# Patient Record
Sex: Female | Born: 1990
Health system: Southern US, Community
[De-identification: ages and names within clinical notes are randomized; demographics above are authoritative.]

## PROBLEM LIST (undated history)

## (undated) DIAGNOSIS — R209 Unspecified disturbances of skin sensation: Secondary | ICD-10-CM

## (undated) DIAGNOSIS — G219 Secondary parkinsonism, unspecified: Secondary | ICD-10-CM

## (undated) DIAGNOSIS — G43909 Migraine, unspecified, not intractable, without status migrainosus: Secondary | ICD-10-CM

## (undated) DIAGNOSIS — R251 Tremor, unspecified: Secondary | ICD-10-CM

## (undated) DIAGNOSIS — F29 Unspecified psychosis not due to a substance or known physiological condition: Secondary | ICD-10-CM

## (undated) DIAGNOSIS — R413 Other amnesia: Secondary | ICD-10-CM

## (undated) DIAGNOSIS — G43019 Migraine without aura, intractable, without status migrainosus: Principal | ICD-10-CM

## (undated) DIAGNOSIS — Z9109 Other allergy status, other than to drugs and biological substances: Secondary | ICD-10-CM

## (undated) DIAGNOSIS — M797 Fibromyalgia: Secondary | ICD-10-CM

## (undated) DIAGNOSIS — F209 Schizophrenia, unspecified: Secondary | ICD-10-CM

## (undated) HISTORY — DX: Migraine without aura, intractable, without status migrainosus: G43.019

## (undated) HISTORY — DX: Other allergy status, other than to drugs and biological substances: Z91.09

## (undated) HISTORY — DX: Fibromyalgia: M79.7

## (undated) HISTORY — DX: Unspecified disturbances of skin sensation: R20.9

## (undated) HISTORY — DX: Migraine, unspecified, not intractable, without status migrainosus: G43.909

## (undated) HISTORY — DX: Secondary parkinsonism, unspecified: G21.9

## (undated) HISTORY — DX: Other amnesia: R41.3

## (undated) HISTORY — DX: Tremor, unspecified: R25.1

## (undated) HISTORY — DX: Schizophrenia, unspecified: F20.9

---
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
## 2012-01-29 ENCOUNTER — Other Ambulatory Visit (HOSPITAL_COMMUNITY): Payer: Self-pay | Admitting: Psychiatry

## 2012-01-29 LAB — CBC
MCH: 28.9 pg (ref 26.0–34.0)
MCHC: 33.6 g/dL (ref 30.0–36.0)
MCV: 86.2 fL (ref 78.0–100.0)
Platelets: 293 10*3/uL (ref 150–400)
RBC: 4.7 MIL/uL (ref 3.87–5.11)

## 2012-08-21 ENCOUNTER — Encounter: Payer: Self-pay | Admitting: Certified Nurse Midwife

## 2012-08-28 ENCOUNTER — Encounter: Payer: Self-pay | Admitting: Certified Nurse Midwife

## 2012-08-28 ENCOUNTER — Ambulatory Visit (INDEPENDENT_AMBULATORY_CARE_PROVIDER_SITE_OTHER): Payer: BC Managed Care – PPO | Admitting: Certified Nurse Midwife

## 2012-08-28 VITALS — BP 102/60 | HR 60 | Resp 16 | Ht 64.75 in | Wt 169.0 lb

## 2012-08-28 DIAGNOSIS — Z01419 Encounter for gynecological examination (general) (routine) without abnormal findings: Secondary | ICD-10-CM

## 2012-08-28 DIAGNOSIS — N943 Premenstrual tension syndrome: Secondary | ICD-10-CM

## 2012-08-28 MED ORDER — DESOGEST-ETH ESTRAD TRIPHASIC 0.1/0.125/0.15 -0.025 MG PO TABS: 1.0000 | ORAL_TABLET | Freq: Every day | ORAL | Status: DC

## 2012-08-28 NOTE — Progress Notes (Signed)
22 y.o. G0P0000 Single Caucasian Fe here for annual exam.  Periods normal.  Much lighter with OCP use for PMS.  Reports PMS much better, no more hugh mood swings.  Now on Lyrica for Fibromyalgia, working well Not sexually active ever. Sees PCP for labs and medication management.  No health issues today.  No LMP recorded.   08-07-12       Sexually active: no  The current method of family planning is abstinence.    Exercising: yes  physical therapy & horseback riding Smoker:  no  Health Maintenance: Pap: none MMG:  none Colonoscopy:  none BMD:   none TDaP:  2008 Labs: none Self breast exam: occ   reports that she has never smoked. She does not have any smokeless tobacco history on file. She reports that she does not drink alcohol or use illicit drugs.  Past Medical History  Diagnosis Date  . Asthma   . Migraines     no aura  . Asthma     seasonal  . Occasional tremors     No past surgical history on file.  Current Outpatient Prescriptions  Medication Sig Dispense Refill  . ARIPiprazole (ABILIFY) 10 MG tablet Take 10 mg by mouth daily.      . Cetirizine HCl 10 MG CAPS Take by mouth daily.      Marland Kitchen desogestrel-ethinyl estradiol (CYCLESSA) 0.1/0.125/0.15 -0.025 MG tablet Take 1 tablet by mouth daily.      Marland Kitchen FLUoxetine (PROZAC) 40 MG capsule Take 40 mg by mouth daily.      . Multiple Vitamin (MULITIVITAMIN WITH MINERALS) TABS Take 1 tablet by mouth daily.      . norgestimate-ethinyl estradiol (ORTHO-CYCLEN,SPRINTEC,PREVIFEM) 0.25-35 MG-MCG tablet Take 1 tablet by mouth daily.      . propranolol (INDERAL) 40 MG tablet Take 20 mg by mouth daily.       No current facility-administered medications for this visit.    Family History  Problem Relation Age of Onset  . Breast cancer Mother   . Diabetes Father   . Hypertension Father   . Thyroid disease Maternal Aunt   . Thyroid disease Maternal Grandmother     ROS:  Pertinent items are noted in HPI.  Otherwise, a comprehensive ROS  was negative.  Exam:   There were no vitals taken for this visit.    Ht Readings from Last 3 Encounters:  05/22/11 5' 5.5" (1.664 m) (69%*, Z = 0.50)   * Growth percentiles are based on CDC 2-20 Years data.    General appearance: alert, cooperative and appears stated age Head: Normocephalic, without obvious abnormality, atraumatic Neck: no adenopathy, supple, symmetrical, trachea midline and thyroid normal to inspection and palpation Lungs: clear to auscultation bilaterally Breasts: normal appearance, no masses or tenderness, No nipple retraction or dimpling, No nipple discharge or bleeding, No axillary or supraclavicular adenopathy, Taught monthly breast self examination Heart: regular rate and rhythm Abdomen: soft, non-tender; no masses,  no organomegaly Extremities: extremities normal, atraumatic, no cyanosis or edema, occasional tremor noted  Skin: Skin color, texture, turgor normal. No rashes or lesions Lymph nodes: Cervical, supraclavicular, and axillary nodes normal. No abnormal inguinal nodes palpated Neurologic: Grossly normal   Pelvic: External genitalia:  no lesions              Urethra:  normal appearing urethra with no masses, tenderness or lesions              Bartholin's and Skene's: normal  Vagina: normal appearing vagina with normal color and discharge, no lesions              Cervix: normal non tender              Pap taken: no Bimanual Exam:  Uterus:  normal size, contour, position, consistency, mobility, non-tender and anteflexed              Adnexa: normal adnexa and no mass, fullness, tenderness               Rectovaginal: Confirms               Anus:  normal sphincter tone, no lesions  A:  Well Woman with normal exam  History of PMS, OCP working well for management Not sexually active ever  History of essential tremors since adolescent, no change  Family history of Breast cancer, mother age 20, am age 30? Both had BRAC negative   P:   Reviewed health and wellness pertinent to exam  Rx Cyclessa see order  Continue follow up with PCP as indicated  Stressed SBE, come in if any change, needs mammogram at age 15 unless otherwise indicated   Pap smear as per guidelines   pap smear not taken today  counseled on breast self exam, STD prevention, feminine hygiene, use and side effects of OCP's, adequate intake of calcium and vitamin D, diet and exercise  return annually or prn  An After Visit Summary was printed and given to the patient.  Reviewed, TL

## 2012-08-28 NOTE — Patient Instructions (Addendum)
General topics  Next pap or exam is  due in 1 year Take a Women's multivitamin Take 1200 mg. of calcium daily - prefer dietary If any concerns in interim to call back  Breast Self-Awareness Practicing breast self-awareness may pick up problems early, prevent significant medical complications, and possibly save your life. By practicing breast self-awareness, you can become familiar with how your breasts look and feel and if your breasts are changing. This allows you to notice changes early. It can also offer you some reassurance that your breast health is good. One way to learn what is normal for your breasts and whether your breasts are changing is to do a breast self-exam. If you find a lump or something that was not present in the past, it is best to contact your caregiver right away. Other findings that should be evaluated by your caregiver include nipple discharge, especially if it is bloody; skin changes or reddening; areas where the skin seems to be pulled in (retracted); or new lumps and bumps. Breast pain is seldom associated with cancer (malignancy), but should also be evaluated by a caregiver. BREAST SELF-EXAM The best time to examine your breasts is 5 7 days after your menstrual period is over.  ExitCare Patient Information 2013 [employer].   Exercise to Stay Healthy Exercise helps you become and stay healthy. EXERCISE IDEAS AND TIPS Choose exercises that:  You enjoy.  Fit into your day. You do not need to exercise really hard to be healthy. You can do exercises at a slow or medium level and stay healthy. You can:  Stretch before and after working out.  Try yoga, Pilates, or tai chi.  Lift weights.  Walk fast, swim, jog, run, climb stairs, bicycle, dance, or rollerskate.  Take aerobic classes. Exercises that burn about 150 calories:  Running 1  miles in 15 minutes.  Playing volleyball for 45 to 60 minutes.  Washing and waxing a car for 45 to 60  minutes.  Playing touch football for 45 minutes.  Walking 1  miles in 35 minutes.  Pushing a stroller 1  miles in 30 minutes.  Playing basketball for 30 minutes.  Raking leaves for 30 minutes.  Bicycling 5 miles in 30 minutes.  Walking 2 miles in 30 minutes.  Dancing for 30 minutes.  Shoveling snow for 15 minutes.  Swimming laps for 20 minutes.  Walking up stairs for 15 minutes.  Bicycling 4 miles in 15 minutes.  Gardening for 30 to 45 minutes.  Jumping rope for 15 minutes.  Washing windows or floors for 45 to 60 minutes. Document Released: 04/03/2010 Document Revised: 05/24/2011 Document Reviewed: 04/03/2010 [employer] Patient Information 2013 [employer].   Other topics ( that may be useful information):    Sexually Transmitted Disease Sexually transmitted disease (STD) refers to any infection that is passed from person to person during sexual activity. This may happen by way of saliva, semen, blood, vaginal mucus, or urine. Common STDs include:  Gonorrhea.  Chlamydia.  Syphilis.  HIV/AIDS.  Genital herpes.  Hepatitis B and C.  Trichomonas.  Human papillomavirus (HPV).  Pubic lice. CAUSES  An STD may be spread by bacteria, virus, or parasite. A person can get an STD by:  Sexual intercourse with an infected person.  Sharing sex toys with an infected person.  Sharing needles with an infected person.  Having intimate contact with the genitals, mouth, or rectal areas of an infected person. SYMPTOMS  Some people may not have any symptoms, but   they can still pass the infection to others. Different STDs have different symptoms. Symptoms include:  Painful or bloody urination.  Pain in the pelvis, abdomen, vagina, anus, throat, or eyes.  Skin rash, itching, irritation, growths, or sores (lesions). These usually occur in the genital or anal area.  Abnormal vaginal discharge.  Penile discharge in men.  Soft, flesh-colored skin growths in the  genital or anal area.  Fever.  Pain or bleeding during sexual intercourse.  Swollen glands in the groin area.  Yellow skin and eyes (jaundice). This is seen with hepatitis. DIAGNOSIS  To make a diagnosis, your caregiver may:  Take a medical history.  Perform a physical exam.  Take a specimen (culture) to be examined.  Examine a sample of discharge under a microscope.  Perform blood test TREATMENT   Chlamydia, gonorrhea, trichomonas, and syphilis can be cured with antibiotic medicine.  Genital herpes, hepatitis, and HIV can be treated, but not cured, with prescribed medicines. The medicines will lessen the symptoms.  Genital warts from HPV can be treated with medicine or by freezing, burning (electrocautery), or surgery. Warts may come back.  HPV is a virus and cannot be cured with medicine or surgery.However, abnormal areas may be followed very closely by your caregiver and may be removed from the cervix, vagina, or vulva through office procedures or surgery. If your diagnosis is confirmed, your recent sexual partners need treatment. This is true even if they are symptom-free or have a negative culture or evaluation. They should not have sex until their caregiver says it is okay. HOME CARE INSTRUCTIONS  All sexual partners should be informed, tested, and treated for all STDs.  Take your antibiotics as directed. Finish them even if you start to feel better.  Only take over-the-counter or prescription medicines for pain, discomfort, or fever as directed by your caregiver.  Rest.  Eat a balanced diet and drink enough fluids to keep your urine clear or pale yellow.  Do not have sex until treatment is completed and you have followed up with your caregiver. STDs should be checked after treatment.  Keep all follow-up appointments, Pap tests, and blood tests as directed by your caregiver.  Only use latex condoms and water-soluble lubricants during sexual activity. Do not use  petroleum jelly or oils.  Avoid alcohol and illegal drugs.  Get vaccinated for HPV and hepatitis. If you have not received these vaccines in the past, talk to your caregiver about whether one or both might be right for you.  Avoid risky sex practices that can break the skin. The only way to avoid getting an STD is to avoid all sexual activity.Latex condoms and dental dams (for oral sex) will help lessen the risk of getting an STD, but will not completely eliminate the risk. SEEK MEDICAL CARE IF:   You have a fever.  You have any new or worsening symptoms. Document Released: 05/22/2002 Document Revised: 05/24/2011 Document Reviewed: 05/29/2010 Exit[employer] Patient Information 2013 [employer].    Domestic Abuse You are being battered or abused if someone close to you hits, pushes, or physically hurts you in any way. You also are being abused if you are forced into activities. You are being sexually abused if you are forced to have sexual contact of any kind. You are being emotionally abused if you are made to feel worthless or if you are constantly threatened. It is important to remember that help is available. No one has the right to abuse you. PREVENTION OF FURTHER   ABUSE  Learn the warning signs of danger. This varies with situations but may include: the use of alcohol, threats, isolation from friends and family, or forced sexual contact. Leave if you feel that violence is going to occur.  If you are attacked or beaten, report it to the police so the abuse is documented. You do not have to press charges. The police can protect you while you or the attackers are leaving. Get the officer's name and badge number and a copy of the report.  Find someone you can trust and tell them what is happening to you: your caregiver, a nurse, clergy member, close friend or family member. Feeling ashamed is natural, but remember that you have done nothing wrong. No one deserves abuse. Document Released:  02/27/2000 Document Revised: 05/24/2011 Document Reviewed: 05/07/2010 Exit[employer] Patient Information 2013 [employer].    How Much is Too Much Alcohol? Drinking too much alcohol can cause injury, accidents, and health problems. These types of problems can include:   Car crashes.  Falls.  Family fighting (domestic violence).  Drowning.  Fights.  Injuries.  Burns.  Damage to certain organs.  Having a baby with birth defects. ONE DRINK CAN BE TOO MUCH WHEN YOU ARE:  Working.  Pregnant or breastfeeding.  Taking medicines. Ask your doctor.  Driving or planning to drive. If you or someone you know has a drinking problem, get help from a doctor.  Document Released: 12/26/2008 Document Revised: 05/24/2011 Document Reviewed: 12/26/2008 Exit[employer] Patient Information 2013 [employer].   Smoking Hazards Smoking cigarettes is extremely bad for your health. Tobacco smoke has over 200 known poisons in it. There are over 60 chemicals in tobacco smoke that cause cancer. Some of the chemicals found in cigarette smoke include:   Cyanide.  Benzene.  Formaldehyde.  Methanol (wood alcohol).  Acetylene (fuel used in welding torches).  Ammonia. Cigarette smoke also contains the poisonous gases nitrogen oxide and carbon monoxide.  Cigarette smokers have an increased risk of many serious medical problems and Smoking causes approximately:  90% of all lung cancer deaths in men.  80% of all lung cancer deaths in women.  90% of deaths from chronic obstructive lung disease. Compared with nonsmokers, smoking increases the risk of:  Coronary heart disease by 2 to 4 times.  Stroke by 2 to 4 times.  Men developing lung cancer by 23 times.  Women developing lung cancer by 13 times.  Dying from chronic obstructive lung diseases by 12 times.  . Smoking is the most preventable cause of death and disease in our society.  WHY IS SMOKING ADDICTIVE?  Nicotine is the chemical  agent in tobacco that is capable of causing addiction or dependence.  When you smoke and inhale, nicotine is absorbed rapidly into the bloodstream through your lungs. Nicotine absorbed through the lungs is capable of creating a powerful addiction. Both inhaled and non-inhaled nicotine may be addictive.  Addiction studies of cigarettes and spit tobacco show that addiction to nicotine occurs mainly during the teen years, when young people begin using tobacco products. WHAT ARE THE BENEFITS OF QUITTING?  There are many health benefits to quitting smoking.   Likelihood of developing cancer and heart disease decreases. Health improvements are seen almost immediately.  Blood pressure, pulse rate, and breathing patterns start returning to normal soon after quitting. QUITTING SMOKING   American Lung Association - 1-800-LUNGUSA  American Cancer Society - 1-800-ACS-2345 Document Released: 04/08/2004 Document Revised: 05/24/2011 Document Reviewed: 12/11/2008 Exit[employer] Patient Information 2013 [employer].   Stress Management Stress is a state of physical or mental tension that often results from changes in your life or normal routine. Some common causes of stress are:  Death of a loved one.  Injuries or severe illnesses.  Getting fired or changing jobs.  Moving into a new home. Other causes may be:  Sexual problems.  Business or financial losses.  Taking on a large debt.  Regular conflict with someone at home or at work.  Constant tiredness from lack of sleep. It is not just bad things that are stressful. It may be stressful to:  Win the lottery.  Get married.  Buy a new car. The amount of stress that can be easily tolerated varies from person to person. Changes generally cause stress, regardless of the types of change. Too much stress can affect your health. It may lead to physical or emotional problems. Too little stress (boredom) may also become stressful. SUGGESTIONS TO  REDUCE STRESS:  Talk things over with your family and friends. It often is helpful to share your concerns and worries. If you feel your problem is serious, you may want to get help from a professional counselor.  Consider your problems one at a time instead of lumping them all together. Trying to take care of everything at once may seem impossible. List all the things you need to do and then start with the most important one. Set a goal to accomplish 2 or 3 things each day. If you expect to do too many in a single day you will naturally fail, causing you to feel even more stressed.  Do not use alcohol or drugs to relieve stress. Although you may feel better for a short time, they do not remove the problems that caused the stress. They can also be habit forming.  Exercise regularly - at least 3 times per week. Physical exercise can help to relieve that "uptight" feeling and will relax you.  The shortest distance between despair and hope is often a good night's sleep.  Go to bed and get up on time allowing yourself time for appointments without being rushed.  Take a short "time-out" period from any stressful situation that occurs during the day. Close your eyes and take some deep breaths. Starting with the muscles in your face, tense them, hold it for a few seconds, then relax. Repeat this with the muscles in your neck, shoulders, hand, stomach, back and legs.  Take good care of yourself. Eat a balanced diet and get plenty of rest.  Schedule time for having fun. Take a break from your daily routine to relax. HOME CARE INSTRUCTIONS   Call if you feel overwhelmed by your problems and feel you can no longer manage them on your own.  Return immediately if you feel like hurting yourself or someone else. Document Released: 08/25/2000 Document Revised: 05/24/2011 Document Reviewed: 04/17/2007 Exit[employer] Patient Information 2013 [employer].   

## 2012-09-04 ENCOUNTER — Other Ambulatory Visit: Payer: Self-pay | Admitting: Certified Nurse Midwife

## 2012-12-04 DIAGNOSIS — IMO0001 Reserved for inherently not codable concepts without codable children: Secondary | ICD-10-CM

## 2012-12-04 DIAGNOSIS — G250 Essential tremor: Secondary | ICD-10-CM

## 2012-12-04 DIAGNOSIS — M2420 Disorder of ligament, unspecified site: Secondary | ICD-10-CM

## 2012-12-04 DIAGNOSIS — F341 Dysthymic disorder: Secondary | ICD-10-CM

## 2013-01-09 ENCOUNTER — Encounter: Payer: Self-pay | Admitting: Pediatrics

## 2013-01-09 ENCOUNTER — Ambulatory Visit (INDEPENDENT_AMBULATORY_CARE_PROVIDER_SITE_OTHER): Payer: BC Managed Care – PPO | Admitting: Pediatrics

## 2013-01-09 VITALS — BP 90/66 | HR 72 | Ht 66.5 in | Wt 168.4 lb

## 2013-01-09 DIAGNOSIS — Z68.41 Body mass index (BMI) pediatric, 85th percentile to less than 95th percentile for age: Secondary | ICD-10-CM

## 2013-01-09 DIAGNOSIS — M797 Fibromyalgia: Secondary | ICD-10-CM

## 2013-01-09 DIAGNOSIS — G609 Hereditary and idiopathic neuropathy, unspecified: Secondary | ICD-10-CM

## 2013-01-09 DIAGNOSIS — G250 Essential tremor: Secondary | ICD-10-CM

## 2013-01-09 DIAGNOSIS — IMO0001 Reserved for inherently not codable concepts without codable children: Secondary | ICD-10-CM

## 2013-01-09 MED ORDER — PROPRANOLOL HCL 40 MG PO TABS: ORAL_TABLET | ORAL | Status: DC

## 2013-01-09 MED ORDER — LYRICA 100 MG PO CAPS: ORAL_CAPSULE | ORAL | Status: DC

## 2013-01-09 NOTE — Progress Notes (Signed)
Patient: Jennifer [name] MRN: [id number] Sex: female DOB: 11-24-1990  Provider: Deetta Perla, MD Location of Care: [facility]  Note type: Routine return visit  History of Present Illness: Referral Source: Dr. Albina Billet History from: mother, patient and [facility] chart Chief Complaint: Tremor/Anxiety Depression   Jennifer [name] is a 22 y.o. female who returns for evaluation and management of intermittent tingling in her fingers with paresthesias and tremor.  The patient was evaluated January 09, 2013, for the first time since December 01, 2010.  At that time, she had episodes of transient alteration of awareness, and benign essential tremor.  The dose has been suddenly escalated over time, and she continues to have tremor.  The patient had infrequent migraines and in the past she had an EEG looking for the presence of seizures, which showed mild diffuse swelling.  In the past she has been evaluated for psychosis that currently seems to be well treated.  She has asthma.  She had syncope in the past, but does not have that currently.  Over the last night she has developed tingling in her finger tips that is intermittent and uncomfortable sometimes there is a paresthetic quality to it.  These symptoms may have started less prominently as long as a month ago.  In addition to her tremor, she has a tapping of her toes and her index finger.  Tremor at times makes it difficult for her to use her hands in fine motor skills.  Review of Systems: 12 system review was remarkable for murmur, feeling hot, allergies, headache, numbness, dizziness, tremor and anxiety  Past Medical History  Diagnosis Date  . Asthma   . Migraines     no aura  . Occasional tremors   . Environmental allergies     seasonal  . Fibromyalgia   . Memory loss    Hospitalizations: no, Head Injury: no, Nervous System Infections: no, Immunizations up to date: yes Past Medical History Comments:  none.  Birth History 7 lbs. 11 oz. infant born at full-term.  Gestation complicated by maternal need for heparin and progesterone to prevent spontaneous abortion, spotting and bleeding.  Labor lasted for 12 hours, was induced, and mother received epidural anesthesia.  Normal spontaneous vaginal delivery.  Nursery course was unremarkable. Growth and development was recalled as normal.  Behavior History depression and anxiety.  Surgical History History reviewed. No pertinent past surgical history.  Family History family history includes Alzheimer's disease in her maternal grandfather; Breast cancer in her mother; Breast cancer (age of onset: 18) in her maternal aunt; Diabetes in her father; Heart attack in her paternal grandfather; Hypertension in her father; Lung cancer in her paternal grandmother; Thyroid disease in her maternal aunt and maternal grandmother. Family History is negative migraines, seizures, cognitive impairment, blindness, deafness, birth defects, chromosomal disorder, autism.  Social History History   Social History  . Marital Status: Single    Spouse Name: N/A    Number of Children: N/A  . Years of Education: N/A   Social History Main Topics  . Smoking status: Never Smoker   . Smokeless tobacco: Never Used  . Alcohol Use: No  . Drug Use: No  . Sexual Activity: No   Other Topics Concern  . None   Social History Narrative  . None   Educational level junior college School Attending: [employer] Occupation: Consulting civil engineer  Living with both parents  Hobbies/Interest: Reading, witting and drawing  School comments Jerney is attending [employer] where she  is taking general studies, she is taking a semester off and plans to return the following semester.   Current Outpatient Prescriptions on File Prior to Visit  Medication Sig Dispense Refill  . ARIPiprazole (ABILIFY) 10 MG tablet Take 5 mg by mouth daily.       Marland Kitchen desogestrel-ethinyl estradiol  (VELIVET,CAZIANT,CESIA,CYCLESSA) 0.1/0.125/0.15 -0.025 MG tablet Take 1 tablet by mouth daily.  1 Package  12  . FLUoxetine (PROZAC) 40 MG capsule Take 40 mg by mouth daily.      . propranolol (INDERAL) 40 MG tablet Take 40 mg by mouth. Take 1/2 tablet twice daily       No current facility-administered medications on file prior to visit.   The medication list was reviewed and reconciled. All changes or newly prescribed medications were explained.  A complete medication list was provided to the patient/caregiver.  Allergies  Allergen Reactions  . Ambien [Zolpidem Tartrate]     hallucinations   Physical Exam BP 90/66  Pulse 72  Ht 5' 6.5" (1.689 m)  Wt 168 lb 6.4 oz (76.386 kg)  BMI 26.78 kg/m2  General: alert, well developed, well nourished, in no acute distress, brown hair, brown eyes, right handed Head: normocephalic, no dysmorphic features Ears, Nose and Throat: Otoscopic: Tympanic membranes normal.  Pharynx: oropharynx is pink without exudates or tonsillar hypertrophy. Neck: supple, full range of motion, no cranial or cervical bruits Respiratory: auscultation clear Cardiovascular: no murmurs, pulses are normal Musculoskeletal: no skeletal deformities or apparent scoliosis Skin: no rashes or neurocutaneous lesions  Neurologic Exam  Mental Status: alert; oriented to person, place and year; knowledge is normal for age; language is normal Cranial Nerves: visual fields are full to double simultaneous stimuli; extraocular movements are full and conjugate; pupils are around reactive to light; funduscopic examination shows sharp disc margins with normal vessels; symmetric facial strength; midline tongue and uvula; air conduction is greater than bone conduction bilaterally. Motor: Normal strength, tone and mass; good fine motor movements; no pronator drift.  Mild tremor in her hands with her hands extended. Sensory: intact responses to cold, vibration, proprioception and  stereognosis Coordination: good finger-to-nose, rapid repetitive alternating movements and finger apposition Gait and Station: normal gait and station: patient is able to walk on heels, toes and tandem without difficulty; balance is adequate; Romberg exam is negative; Gower response is negative Reflexes: symmetric and diminished bilaterally; no clonus; bilateral flexor plantar responses.  Assessment 1.  Fibromyalgia, 729.1. 2.  Essential tremor, 333.1. 3.  Idiopathic peripheral neuropathy, 356.9.  Discussion The patient has diffuse aches and pains that I think are related to fibromyalgia area and this has not changed.  Her essential tremor is stable, and does not require changes and propranolol.  I'm not certain that she would tolerate them.  I refilled a prescription for that today.  She continues to take Lyrica for her pain, fluoxetine and Abilify for depression.  Not certain why she has neuropathy, but has not caused significant problems.  Reflexes are diminished distally, but she does not showing significant signs of sensory and motor neuropathy in terms of wasting or weakness.  We will check laboratories to determine if there is a treatable cause of neuropathy.  I think they will be negative.  I'm not going to send her for nerve conduction EMGs unless her symptoms progress.  She has involuntary movement of her right leg it is of uncertain significance.  She will return in followup in 6 months.  I'll see her sooner if her laboratory  studies were abnormal and will have her seen by an appropriate specialist.  Deetta Perla MD

## 2013-01-14 ENCOUNTER — Encounter: Payer: Self-pay | Admitting: Pediatrics

## 2013-01-14 DIAGNOSIS — Z68.41 Body mass index (BMI) pediatric, 85th percentile to less than 95th percentile for age: Secondary | ICD-10-CM | POA: Insufficient documentation

## 2013-01-16 LAB — FOLATE: Folate: >20 ng/mL

## 2013-01-17 LAB — VITAMIN B12: Vitamin B-12: 719 pg/mL (ref 211–911)

## 2013-01-17 LAB — GLUCOSE, FASTING: Glucose, Fasting: 83 mg/dL (ref 70–99)

## 2013-01-22 ENCOUNTER — Other Ambulatory Visit: Payer: Self-pay | Admitting: Family

## 2013-01-22 DIAGNOSIS — IMO0001 Reserved for inherently not codable concepts without codable children: Secondary | ICD-10-CM

## 2013-01-22 DIAGNOSIS — G250 Essential tremor: Secondary | ICD-10-CM

## 2013-08-13 ENCOUNTER — Other Ambulatory Visit: Payer: Self-pay | Admitting: *Deleted

## 2013-08-13 DIAGNOSIS — N943 Premenstrual tension syndrome: Secondary | ICD-10-CM

## 2013-08-13 MED ORDER — DESOGEST-ETH ESTRAD TRIPHASIC 0.1/0.125/0.15 -0.025 MG PO TABS: 1.0000 | ORAL_TABLET | Freq: Every day | ORAL | Status: DC

## 2013-08-13 NOTE — Telephone Encounter (Signed)
Last AEX and refill 08/28/12 #1 pack/ 12 refills Next appt 09/03/13  Rx sent 3 month supply.

## 2013-09-03 ENCOUNTER — Ambulatory Visit (INDEPENDENT_AMBULATORY_CARE_PROVIDER_SITE_OTHER): Payer: BC Managed Care – PPO | Admitting: Certified Nurse Midwife

## 2013-09-03 ENCOUNTER — Encounter: Payer: Self-pay | Admitting: Certified Nurse Midwife

## 2013-09-03 VITALS — BP 90/62 | HR 64 | Resp 16 | Ht 65.25 in | Wt 168.0 lb

## 2013-09-03 DIAGNOSIS — N390 Urinary tract infection, site not specified: Secondary | ICD-10-CM

## 2013-09-03 DIAGNOSIS — R319 Hematuria, unspecified: Secondary | ICD-10-CM

## 2013-09-03 DIAGNOSIS — Z01419 Encounter for gynecological examination (general) (routine) without abnormal findings: Secondary | ICD-10-CM

## 2013-09-03 DIAGNOSIS — N943 Premenstrual tension syndrome: Secondary | ICD-10-CM

## 2013-09-03 DIAGNOSIS — Z0000 Encounter for general adult medical examination without abnormal findings: Secondary | ICD-10-CM

## 2013-09-03 LAB — POCT URINALYSIS DIPSTICK
BILIRUBIN UA: NEGATIVE
Glucose, UA: NEGATIVE
KETONES UA: NEGATIVE
Nitrite, UA: POSITIVE
Urobilinogen, UA: NEGATIVE
pH, UA: 5

## 2013-09-03 MED ORDER — NITROFURANTOIN MONOHYD MACRO 100 MG PO CAPS: 100.0000 mg | ORAL_CAPSULE | Freq: Two times a day (BID) | ORAL | Status: DC

## 2013-09-03 MED ORDER — DESOGEST-ETH ESTRAD TRIPHASIC 0.1/0.125/0.15 -0.025 MG PO TABS: 1.0000 | ORAL_TABLET | Freq: Every day | ORAL | Status: DC

## 2013-09-03 NOTE — Patient Instructions (Signed)
General topics  Next pap or exam is  due in 1 year Take a Women's multivitamin Take 1200 mg. of calcium daily - prefer dietary If any concerns in interim to call back  Breast Self-Awareness Practicing breast self-awareness may pick up problems early, prevent significant medical complications, and possibly save your life. By practicing breast self-awareness, you can become familiar with how your breasts look and feel and if your breasts are changing. This allows you to notice changes early. It can also offer you some reassurance that your breast health is good. One way to learn what is normal for your breasts and whether your breasts are changing is to do a breast self-exam. If you find a lump or something that was not present in the past, it is best to contact your caregiver right away. Other findings that should be evaluated by your caregiver include nipple discharge, especially if it is bloody; skin changes or reddening; areas where the skin seems to be pulled in (retracted); or new lumps and bumps. Breast pain is seldom associated with cancer (malignancy), but should also be evaluated by a caregiver. BREAST SELF-EXAM The best time to examine your breasts is 5 7 days after your menstrual period is over.  ExitCare Patient Information 2013 [employer].   Exercise to Stay Healthy Exercise helps you become and stay healthy. EXERCISE IDEAS AND TIPS Choose exercises that:  You enjoy.  Fit into your day. You do not need to exercise really hard to be healthy. You can do exercises at a slow or medium level and stay healthy. You can:  Stretch before and after working out.  Try yoga, Pilates, or tai chi.  Lift weights.  Walk fast, swim, jog, run, climb stairs, bicycle, dance, or rollerskate.  Take aerobic classes. Exercises that burn about 150 calories:  Running 1  miles in 15 minutes.  Playing volleyball for 45 to 60 minutes.  Washing and waxing a car for 45 to 60  minutes.  Playing touch football for 45 minutes.  Walking 1  miles in 35 minutes.  Pushing a stroller 1  miles in 30 minutes.  Playing basketball for 30 minutes.  Raking leaves for 30 minutes.  Bicycling 5 miles in 30 minutes.  Walking 2 miles in 30 minutes.  Dancing for 30 minutes.  Shoveling snow for 15 minutes.  Swimming laps for 20 minutes.  Walking up stairs for 15 minutes.  Bicycling 4 miles in 15 minutes.  Gardening for 30 to 45 minutes.  Jumping rope for 15 minutes.  Washing windows or floors for 45 to 60 minutes. Document Released: 04/03/2010 Document Revised: 05/24/2011 Document Reviewed: 04/03/2010 ExitCare Patient Information 2013 [employer].   Other topics ( that may be useful information):    Sexually Transmitted Disease Sexually transmitted disease (STD) refers to any infection that is passed from person to person during sexual activity. This may happen by way of saliva, semen, blood, vaginal mucus, or urine. Common STDs include:  Gonorrhea.  Chlamydia.  Syphilis.  HIV/AIDS.  Genital herpes.  Hepatitis B and C.  Trichomonas.  Human papillomavirus (HPV).  Pubic lice. CAUSES  An STD may be spread by bacteria, virus, or parasite. A person can get an STD by:  Sexual intercourse with an infected person.  Sharing sex toys with an infected person.  Sharing needles with an infected person.  Having intimate contact with the genitals, mouth, or rectal areas of an infected person. SYMPTOMS  Some people may not have any symptoms, but   they can still pass the infection to others. Different STDs have different symptoms. Symptoms include:  Painful or bloody urination.  Pain in the pelvis, abdomen, vagina, anus, throat, or eyes.  Skin rash, itching, irritation, growths, or sores (lesions). These usually occur in the genital or anal area.  Abnormal vaginal discharge.  Penile discharge in men.  Soft, flesh-colored skin growths in the  genital or anal area.  Fever.  Pain or bleeding during sexual intercourse.  Swollen glands in the groin area.  Yellow skin and eyes (jaundice). This is seen with hepatitis. DIAGNOSIS  To make a diagnosis, your caregiver may:  Take a medical history.  Perform a physical exam.  Take a specimen (culture) to be examined.  Examine a sample of discharge under a microscope.  Perform blood test TREATMENT   Chlamydia, gonorrhea, trichomonas, and syphilis can be cured with antibiotic medicine.  Genital herpes, hepatitis, and HIV can be treated, but not cured, with prescribed medicines. The medicines will lessen the symptoms.  Genital warts from HPV can be treated with medicine or by freezing, burning (electrocautery), or surgery. Warts may come back.  HPV is a virus and cannot be cured with medicine or surgery.However, abnormal areas may be followed very closely by your caregiver and may be removed from the cervix, vagina, or vulva through office procedures or surgery. If your diagnosis is confirmed, your recent sexual partners need treatment. This is true even if they are symptom-free or have a negative culture or evaluation. They should not have sex until their caregiver says it is okay. HOME CARE INSTRUCTIONS  All sexual partners should be informed, tested, and treated for all STDs.  Take your antibiotics as directed. Finish them even if you start to feel better.  Only take over-the-counter or prescription medicines for pain, discomfort, or fever as directed by your caregiver.  Rest.  Eat a balanced diet and drink enough fluids to keep your urine clear or pale yellow.  Do not have sex until treatment is completed and you have followed up with your caregiver. STDs should be checked after treatment.  Keep all follow-up appointments, Pap tests, and blood tests as directed by your caregiver.  Only use latex condoms and water-soluble lubricants during sexual activity. Do not use  petroleum jelly or oils.  Avoid alcohol and illegal drugs.  Get vaccinated for HPV and hepatitis. If you have not received these vaccines in the past, talk to your caregiver about whether one or both might be right for you.  Avoid risky sex practices that can break the skin. The only way to avoid getting an STD is to avoid all sexual activity.Latex condoms and dental dams (for oral sex) will help lessen the risk of getting an STD, but will not completely eliminate the risk. SEEK MEDICAL CARE IF:   You have a fever.  You have any new or worsening symptoms. Document Released: 05/22/2002 Document Revised: 05/24/2011 Document Reviewed: 05/29/2010 [facility] Patient Information 2013 [place].    Domestic Abuse You are being battered or abused if someone close to you hits, pushes, or physically hurts you in any way. You also are being abused if you are forced into activities. You are being sexually abused if you are forced to have sexual contact of any kind. You are being emotionally abused if you are made to feel worthless or if you are constantly threatened. It is important to remember that help is available. No one has the right to abuse you. PREVENTION OF FURTHER  ABUSE  Learn the warning signs of danger. This varies with situations but may include: the use of alcohol, threats, isolation from friends and family, or forced sexual contact. Leave if you feel that violence is going to occur.  If you are attacked or beaten, report it to the police so the abuse is documented. You do not have to press charges. The police can protect you while you or the attackers are leaving. Get the officer's name and badge number and a copy of the report.  Find someone you can trust and tell them what is happening to you: your caregiver, a nurse, clergy member, close friend or family member. Feeling ashamed is natural, but remember that you have done nothing wrong. No one deserves abuse. Document Released:  02/27/2000 Document Revised: 05/24/2011 Document Reviewed: 05/07/2010 ExitCare Patient Information 2013 [employer].    How Much is Too Much Alcohol? Drinking too much alcohol can cause injury, accidents, and health problems. These types of problems can include:   Car crashes.  Falls.  Family fighting (domestic violence).  Drowning.  Fights.  Injuries.  Burns.  Damage to certain organs.  Having a baby with birth defects. ONE DRINK CAN BE TOO MUCH WHEN YOU ARE:  Working.  Pregnant or breastfeeding.  Taking medicines. Ask your doctor.  Driving or planning to drive. If you or someone you know has a drinking problem, get help from a doctor.  Document Released: 12/26/2008 Document Revised: 05/24/2011 Document Reviewed: 12/26/2008 ExitCare Patient Information 2013 [employer].   Smoking Hazards Smoking cigarettes is extremely bad for your health. Tobacco smoke has over 200 known poisons in it. There are over 60 chemicals in tobacco smoke that cause cancer. Some of the chemicals found in cigarette smoke include:   Cyanide.  Benzene.  Formaldehyde.  Methanol (wood alcohol).  Acetylene (fuel used in welding torches).  Ammonia. Cigarette smoke also contains the poisonous gases nitrogen oxide and carbon monoxide.  Cigarette smokers have an increased risk of many serious medical problems and Smoking causes approximately:  90% of all lung cancer deaths in men.  80% of all lung cancer deaths in women.  90% of deaths from chronic obstructive lung disease. Compared with nonsmokers, smoking increases the risk of:  Coronary heart disease by 2 to 4 times.  Stroke by 2 to 4 times.  Men developing lung cancer by 23 times.  Women developing lung cancer by 13 times.  Dying from chronic obstructive lung diseases by 12 times.  . Smoking is the most preventable cause of death and disease in our society.  WHY IS SMOKING ADDICTIVE?  Nicotine is the chemical  agent in tobacco that is capable of causing addiction or dependence.  When you smoke and inhale, nicotine is absorbed rapidly into the bloodstream through your lungs. Nicotine absorbed through the lungs is capable of creating a powerful addiction. Both inhaled and non-inhaled nicotine may be addictive.  Addiction studies of cigarettes and spit tobacco show that addiction to nicotine occurs mainly during the teen years, when young people begin using tobacco products. WHAT ARE THE BENEFITS OF QUITTING?  There are many health benefits to quitting smoking.   Likelihood of developing cancer and heart disease decreases. Health improvements are seen almost immediately.  Blood pressure, pulse rate, and breathing patterns start returning to normal soon after quitting. QUITTING SMOKING   American Lung Association - 1-800-LUNGUSA  American Cancer Society - 1-800-ACS-2345 Document Released: 04/08/2004 Document Revised: 05/24/2011 Document Reviewed: 12/11/2008 ExitCare Patient Information 2013 ExitCare,   LLC.   Stress Management Stress is a state of physical or mental tension that often results from changes in your life or normal routine. Some common causes of stress are:  Death of a loved one.  Injuries or severe illnesses.  Getting fired or changing jobs.  Moving into a new home. Other causes may be:  Sexual problems.  Business or financial losses.  Taking on a large debt.  Regular conflict with someone at home or at work.  Constant tiredness from lack of sleep. It is not just bad things that are stressful. It may be stressful to:  Win the lottery.  Get married.  Buy a new car. The amount of stress that can be easily tolerated varies from person to person. Changes generally cause stress, regardless of the types of change. Too much stress can affect your health. It may lead to physical or emotional problems. Too little stress (boredom) may also become stressful. SUGGESTIONS TO  REDUCE STRESS:  Talk things over with your family and friends. It often is helpful to share your concerns and worries. If you feel your problem is serious, you may want to get help from a professional counselor.  Consider your problems one at a time instead of lumping them all together. Trying to take care of everything at once may seem impossible. List all the things you need to do and then start with the most important one. Set a goal to accomplish 2 or 3 things each day. If you expect to do too many in a single day you will naturally fail, causing you to feel even more stressed.  Do not use alcohol or drugs to relieve stress. Although you may feel better for a short time, they do not remove the problems that caused the stress. They can also be habit forming.  Exercise regularly - at least 3 times per week. Physical exercise can help to relieve that "uptight" feeling and will relax you.  The shortest distance between despair and hope is often a good night's sleep.  Go to bed and get up on time allowing yourself time for appointments without being rushed.  Take a short "time-out" period from any stressful situation that occurs during the day. Close your eyes and take some deep breaths. Starting with the muscles in your face, tense them, hold it for a few seconds, then relax. Repeat this with the muscles in your neck, shoulders, hand, stomach, back and legs.  Take good care of yourself. Eat a balanced diet and get plenty of rest.  Schedule time for having fun. Take a break from your daily routine to relax. HOME CARE INSTRUCTIONS   Call if you feel overwhelmed by your problems and feel you can no longer manage them on your own.  Return immediately if you feel like hurting yourself or someone else. Document Released: 08/25/2000 Document Revised: 05/24/2011 Document Reviewed: 04/17/2007 [facility] Patient Information 2013 [place].  Urinary Tract Infection Urinary tract infections  (UTIs) can develop anywhere along your urinary tract. Your urinary tract is your body's drainage system for removing wastes and extra water. Your urinary tract includes two kidneys, two ureters, a bladder, and a urethra. Your kidneys are a pair of bean-shaped organs. Each kidney is about the size of your fist. They are located below your ribs, one on each side of your spine. CAUSES Infections are caused by microbes, which are microscopic organisms, including fungi, viruses, and bacteria. These organisms are so small that they can only be seen  through a microscope. Bacteria are the microbes that most commonly cause UTIs. SYMPTOMS  Symptoms of UTIs may vary by age and gender of the patient and by the location of the infection. Symptoms in young women typically include a frequent and intense urge to urinate and a painful, burning feeling in the bladder or urethra during urination. Older women and men are more likely to be tired, shaky, and weak and have muscle aches and abdominal pain. A fever may mean the infection is in your kidneys. Other symptoms of a kidney infection include pain in your back or sides below the ribs, nausea, and vomiting. DIAGNOSIS To diagnose a UTI, your caregiver will ask you about your symptoms. Your caregiver also will ask to provide a urine sample. The urine sample will be tested for bacteria and white blood cells. White blood cells are made by your body to help fight infection. TREATMENT  Typically, UTIs can be treated with medication. Because most UTIs are caused by a bacterial infection, they usually can be treated with the use of antibiotics. The choice of antibiotic and length of treatment depend on your symptoms and the type of bacteria causing your infection. HOME CARE INSTRUCTIONS  If you were prescribed antibiotics, take them exactly as your caregiver instructs you. Finish the medication even if you feel better after you have only taken some of the medication.  Drink  enough water and fluids to keep your urine clear or pale yellow.  Avoid caffeine, tea, and carbonated beverages. They tend to irritate your bladder.  Empty your bladder often. Avoid holding urine for long periods of time.  Empty your bladder before and after sexual intercourse.  After a bowel movement, women should cleanse from front to back. Use each tissue only once. SEEK MEDICAL CARE IF:   You have back pain.  You develop a fever.  Your symptoms do not begin to resolve within 3 days. SEEK IMMEDIATE MEDICAL CARE IF:   You have severe back pain or lower abdominal pain.  You develop chills.  You have nausea or vomiting.  You have continued burning or discomfort with urination. MAKE SURE YOU:   Understand these instructions.  Will watch your condition.  Will get help right away if you are not doing well or get worse. Document Released: 12/09/2004 Document Revised: 08/31/2011 Document Reviewed: 04/09/2011 ExitCare Patient Information 2015 [employer]. This information is not intended to replace advice given to you by your health care . Make sure you discuss any questions you have with your health care .  

## 2013-09-03 NOTE — Progress Notes (Signed)
22 y.o. G0P0000 Single Caucasian Fe here for annual exam. Periods normal, much better. Contraception for cycle control of PMS working well. Patient complaining of urinary frequency and urgency and slight pain before urination for the past week. No new personal products, drinks adequate water, not sexually active ever. Last UTI per patient one year ago. Patient continues care under Neurology for medication management of adolescent onset of Essential tremors. Patient sees PCP prn. No other health issues today.  Patient's last menstrual period was 08/27/2013.          Sexually active: no  The current method of family planning is OCP (estrogen/progesterone).    Exercising: yes  horseback, physical therapy Smoker:  no  Health Maintenance: Pap: none MMG:  none Colonoscopy:  none BMD:   none TDaP: 2008 Labs: Poct urine-rbc 2+, wbc 2+, nitrite-pos, protein-2+ Self breast exam: done occ   reports that she has never smoked. She has never used smokeless tobacco. She reports that she does not drink alcohol or use illicit drugs.  Past Medical History  Diagnosis Date  . Asthma   . Migraines     no aura  . Occasional tremors   . Environmental allergies     seasonal  . Fibromyalgia   . Memory loss     No past surgical history on file.  Current Outpatient Prescriptions  Medication Sig Dispense Refill  . ALBUTEROL IN Inhale into the lungs as needed.      . ARIPiprazole (ABILIFY) 10 MG tablet Take 5 mg by mouth daily.       Marland Kitchen. desogestrel-ethinyl estradiol (VELIVET,CAZIANT,CESIA,CYCLESSA) 0.1/0.125/0.15 -0.025 MG tablet Take 1 tablet by mouth daily.  3 Package  0  . FLUoxetine (PROZAC) 40 MG capsule Take 40 mg by mouth daily.      Marland Kitchen. LYRICA 100 MG capsule One by mouth 3 times a day  93 capsule  5  . propranolol (INDERAL) 40 MG tablet Take 1/2 tablet twice daily  31 tablet  5   No current facility-administered medications for this visit.    Family History  Problem Relation Age of Onset  .  Breast cancer Mother     BRAC negative  age 21  . Diabetes Father   . Hypertension Father   . Thyroid disease Maternal Aunt   . Breast cancer Maternal Aunt 45    BRAC negative  . Thyroid disease Maternal Grandmother   . Alzheimer's disease Maternal Grandfather   . Lung cancer Paternal Grandmother   . Heart attack Paternal Grandfather     ROS:  Pertinent items are noted in HPI.  Otherwise, a comprehensive ROS was negative.  Exam:   BP 90/62  Pulse 64  Resp 16  Ht 5' 5.25" (1.657 m)  Wt 168 lb (76.204 kg)  BMI 27.75 kg/m2  LMP 08/27/2013 Height: 5' 5.25" (165.7 cm)  Ht Readings from Last 3 Encounters:  09/03/13 5' 5.25" (1.657 m)  01/09/13 5' 6.5" (1.689 m) (81%*, Z = 0.86)  12/04/12 5' 6.25" (1.683 m) (78%*, Z = 0.77)   * Growth percentiles are based on CDC 2-20 Years data.    General appearance: alert, cooperative and appears stated age Head: Normocephalic, without obvious abnormality, atraumatic Neck: no adenopathy, supple, symmetrical, trachea midline and thyroid normal to inspection and palpation and non-palpable Lungs: clear to auscultation bilaterally Breasts: normal appearance, no masses or tenderness, No nipple retraction or dimpling, No nipple discharge or bleeding, No axillary or supraclavicular adenopathy Heart: regular rate and rhythm Abdomen: soft, positive  suprapubic tenderness no masses,  no organomegaly Extremities: extremities normal, atraumatic, no cyanosis or edema Skin: Skin color, texture, turgor normal. No rashes or lesions, warm and dry CVAT negative bilateral Lymph nodes: Cervical, supraclavicular, and axillary nodes normal. No abnormal inguinal nodes palpated Neurologic: Grossly normal   Pelvic: External genitalia:  no lesions              Urethra:  normal appearing urethra with no masses. Tenderness noted and bladder and urethral meatus tender.             Bartholin's and Skene's: normal                 Vagina: normal appearing vagina with  normal color and discharge, no lesions, scant blood noted from menses              Cervix: normal, non tender, scant blood noted              Pap taken: no Bimanual Exam:  Uterus:  normal size, contour, position, consistency, mobility, non-tender and anteflexed              Adnexa: normal adnexa and no mass, fullness, tenderness               Rectovaginal: Confirms               Anus:  Deferred  A:  Well Woman with normal exam  OCP working well for PMS cycle management  UTI  Family history of Breast cancer mother age 21 and MA age 21, both BRAC negative  P:   Reviewed health and wellness pertinent exam  Rx Cyclessa see order  Discussed findings consistent with UTI and need for Rx. Discussed increasing water intake and limited tea and coffee use. Warning signs of UTI given and need to evaluate.  Rx Macrobid see order with instructions  Pap smear not taken today, at age 23 Encouraged self breast exam and start mammograms at ate 37.   counseled on breast self exam, STD prevention, HIV risk factors and prevention, adequate intake of calcium and vitamin D, diet and exercise  return annually or prn, TOC 2 weeks if culture positive  An After Visit Summary was printed and given to the patient.

## 2013-09-04 LAB — URINALYSIS, MICROSCOPIC ONLY
CRYSTALS: NONE SEEN
Casts: NONE SEEN

## 2013-09-05 LAB — URINE CULTURE

## 2013-09-06 ENCOUNTER — Telehealth: Payer: Self-pay

## 2013-09-06 NOTE — Telephone Encounter (Signed)
Returning a call to Joy. °

## 2013-09-06 NOTE — Telephone Encounter (Signed)
lmtcb

## 2013-09-06 NOTE — Telephone Encounter (Signed)
Patient notified of urine results as written by provider. See lab 

## 2013-09-10 NOTE — Progress Notes (Signed)
Reviewed personally.  M. Suzanne Miller, MD.  

## 2013-12-04 ENCOUNTER — Encounter (INDEPENDENT_AMBULATORY_CARE_PROVIDER_SITE_OTHER): Payer: Self-pay

## 2013-12-04 ENCOUNTER — Ambulatory Visit (INDEPENDENT_AMBULATORY_CARE_PROVIDER_SITE_OTHER): Payer: BC Managed Care – PPO | Admitting: Neurology

## 2013-12-04 ENCOUNTER — Encounter: Payer: Self-pay | Admitting: Neurology

## 2013-12-04 VITALS — BP 102/65 | HR 69 | Ht 65.0 in | Wt 176.0 lb

## 2013-12-04 DIAGNOSIS — R413 Other amnesia: Secondary | ICD-10-CM

## 2013-12-04 DIAGNOSIS — G252 Other specified forms of tremor: Principal | ICD-10-CM

## 2013-12-04 DIAGNOSIS — R209 Unspecified disturbances of skin sensation: Secondary | ICD-10-CM

## 2013-12-04 DIAGNOSIS — G250 Essential tremor: Secondary | ICD-10-CM

## 2013-12-04 HISTORY — DX: Unspecified disturbances of skin sensation: R20.9

## 2013-12-04 MED ORDER — TOPIRAMATE 25 MG PO TABS: ORAL_TABLET | ORAL | Status: DC

## 2013-12-04 NOTE — Patient Instructions (Signed)

## 2013-12-04 NOTE — Progress Notes (Signed)
Reason for visit: Tremor  Jennifer [name] is a 23 y.o. female  History of present illness:  Jennifer [name] is a 23 year old right-handed white female with a history of a tremor that has been present since she was a toddler. The patient has gradually worsened with the tremor, and she has been treated by Dr. Sharene Skeans in the past. The patient indicates that the tremor does affect her handwriting, not her ability to feed herself. The patient is able to use a computer. She has noted that she may drop things when she tries to hold things in her hands, and she indicates that her fingers will suddenly extend, and she will lose the grip on whatever she is holding. The patient also has a diagnosis of fibromyalgia, and she is on Lyrica for this. She does not believe that the Lyrica has helped her tremor. The patient does report some twitching of the eyelid on occasion. She also has had some numbness in the hands and occasionally in the feet for greater than one year. She has never had MRI or CT evaluation of the brain. She has no family history of tremor. She has a history of significant depression associated with psychosis requiring hospitalization for 5 years ago. The patient has been on propranolol, but she indicates that this has not worsened her depression issues. She is on Abilify, and she has not seen any changing characteristics of her tremor on this medication. She reports that she has never been on lithium in the past. Thyroid studies have been unremarkable in the recent past. She denies any significant balance issues or problems controlling the bowels or the bladder. The patient does not operate a motor vehicle, and she does not work. She comes to this office for an evaluation. She does report some troubles with concentration and memory.  Past Medical History  Diagnosis Date  . Asthma   . Migraines     no aura  . Occasional tremors   . Environmental allergies     seasonal  . Fibromyalgia   .  Memory loss   . Disturbance of skin sensation 12/04/2013    History reviewed. No pertinent past surgical history.  Family History  Problem Relation Age of Onset  . Breast cancer Mother     BRAC negative  age 80  . Diabetes Father   . Hypertension Father   . Thyroid disease Maternal Aunt   . Breast cancer Maternal Aunt 45    BRAC negative  . Thyroid disease Maternal Grandmother   . Alzheimer's disease Maternal Grandfather   . Lung cancer Paternal Grandmother   . Heart attack Paternal Grandfather   . Tremor Neg Hx     Social history:  reports that she has never smoked. She has never used smokeless tobacco. She reports that she does not drink alcohol or use illicit drugs.  Medications:  Current Outpatient Prescriptions on File Prior to Visit  Medication Sig Dispense Refill  . ALBUTEROL IN Inhale into the lungs as needed.      . ARIPiprazole (ABILIFY) 10 MG tablet Take 5 mg by mouth daily.       Marland Kitchen desogestrel-ethinyl estradiol (VELIVET,CAZIANT,CESIA,CYCLESSA) 0.1/0.125/0.15 -0.025 MG tablet Take 1 tablet by mouth daily.  3 Package  4  . FLUoxetine (PROZAC) 40 MG capsule Take 40 mg by mouth daily.      Marland Kitchen LYRICA 100 MG capsule One by mouth 3 times a day  93 capsule  5  . propranolol (INDERAL) 40 MG  tablet Take 1/2 tablet twice daily  31 tablet  5   No current facility-administered medications on file prior to visit.      Allergies  Allergen Reactions  . Ambien [Zolpidem Tartrate]     hallucinations    ROS:  Out of a complete 14 system review of symptoms, the patient complains only of the following symptoms, and all other reviewed systems are negative.  Chest pain Birthmarks Joint pain, achy muscles Allergies Headache, numbness, weakness, dizziness, tremor Anxiety  Blood pressure 102/65, pulse 69, height  (1.651 m), weight 176 lb (79.833 kg).  Physical Exam  General: The patient is alert and cooperative at the time of the examination. The patient is minimally  obese.  Eyes: Pupils are equal, round, and reactive to light. Discs are flat bilaterally.  Neck: The neck is supple, no carotid bruits are noted.  Respiratory: The respiratory examination is clear.  Cardiovascular: The cardiovascular examination reveals a regular rate and rhythm, no obvious murmurs or rubs are noted.  Skin: Extremities are without significant edema.  Neurologic Exam  Mental status: The Mini-Mental status examination done today shows a total score 21/30.  Cranial nerves: Facial symmetry is present. There is good sensation of the face to pinprick and soft touch bilaterally. The strength of the facial muscles and the muscles to head turning and shoulder shrug are normal bilaterally. Speech is well enunciated, no aphasia or dysarthria is noted. Extraocular movements are full. Visual fields are full. The tongue is midline, and the patient has symmetric elevation of the soft palate. No obvious hearing deficits are noted. The patient has a flat affect.  Motor: The motor testing reveals 5 over 5 strength of all 4 extremities. Good symmetric motor tone is noted throughout.  Sensory: Sensory testing is intact to pinprick, soft touch, vibration sensation, and position sense on all 4 extremities, with the exception that vibration sensation is decreased on the left hand and left foot. No evidence of extinction is noted.  Coordination: Cerebellar testing reveals good finger-nose-finger and heel-to-shin bilaterally. The patient does have resting tremors affecting both arms, right greater than left, with the tremor affecting the proximal and distal muscles. The patient does have some intention tremors as well bilaterally.  Gait and station: Gait is normal. Tandem gait is slightly unsteady. Romberg is negative. No drift is seen.  Reflexes: Deep tendon reflexes are symmetric, but are somewhat brisk in the lower extremities bilaterally. Toes are downgoing bilaterally. The patient has 4 or 5  beats of ankle clonus bilaterally.   Assessment/Plan:  1. Tremor  2. History of psychotic depression  3. Memory disturbance  4. History of fibromyalgia  The patient apparently has gotten some benefit from propranolol in the past. Given her history of psychotic depression, I would want to potentially get her off of this medication. I will try her on Topamax to see if this improves the tremor. The patient will be set up for MRI evaluation of the brain, and some blood work today. She will followup through this office in about 3 months. If the patient has difficulty with the Topamax, she will need to call our office. The memory issue will need to be followed over time to determine whether this issue is static or dynamic.  Marlan Palau MD 12/04/2013 7:28 PM  [facility] [address] [place], [place] [address]  Phone [phone] Fax [phone]

## 2013-12-11 ENCOUNTER — Telehealth: Payer: Self-pay | Admitting: Neurology

## 2013-12-11 ENCOUNTER — Telehealth: Payer: Self-pay | Admitting: *Deleted

## 2013-12-11 NOTE — Telephone Encounter (Signed)
Spoke with patient father in regards to xanax. They will arrive @ 1 pm tomorrow.

## 2013-12-11 NOTE — Telephone Encounter (Signed)
Patient's father calling to state that patient is scheduled for a MRI tomorrow and has an anxiety disorder, requesting that patient have a light sedative for the test. Please return call and advise.

## 2013-12-11 NOTE — Telephone Encounter (Signed)
I called the patient. The blood work results are normal. They were done through the office of Dr. Margo AyeHall.

## 2013-12-11 NOTE — Telephone Encounter (Signed)
Okay for patient to come in for pack of Xanax.

## 2013-12-11 NOTE — Telephone Encounter (Signed)
Patient is requesting a sedative to take prior to MRI.  Please advise.  Thank you.  

## 2013-12-12 ENCOUNTER — Ambulatory Visit (INDEPENDENT_AMBULATORY_CARE_PROVIDER_SITE_OTHER): Payer: BC Managed Care – PPO

## 2013-12-12 DIAGNOSIS — R209 Unspecified disturbances of skin sensation: Secondary | ICD-10-CM

## 2013-12-12 DIAGNOSIS — R413 Other amnesia: Secondary | ICD-10-CM

## 2013-12-12 DIAGNOSIS — G250 Essential tremor: Secondary | ICD-10-CM

## 2013-12-12 DIAGNOSIS — G252 Other specified forms of tremor: Principal | ICD-10-CM

## 2013-12-12 NOTE — Telephone Encounter (Signed)
Pt stop by the office today to pick up her Xanax, per Dr. Anne HahnWillis.

## 2013-12-13 ENCOUNTER — Telehealth: Payer: Self-pay | Admitting: Neurology

## 2013-12-13 NOTE — Telephone Encounter (Signed)
I called patient, talked with the father. The MRI the brain is notable for an occasional punctate white matter lesion, this would correlate with the history of migraine headache. Otherwise, the study is unremarkable. Blood work was unremarkable. We will follow the memory issues.   MRI brain 12/13/2013:  Impression   Equivocal MRI brain (without) demonstrating: 1. Few punctate foci of non-specific gliosis in the periventricular and  subcortical white matter. These findings are non-specific and  considerations include autoimmune, inflammatory, post-infectious,  microvascular ischemic or migraine associated etiologies.  2. No acute findings.

## 2013-12-20 ENCOUNTER — Telehealth: Payer: Self-pay | Admitting: Neurology

## 2013-12-20 NOTE — Telephone Encounter (Signed)
The patient currently is on 20 mg twice daily. The patient will go to 20 mg daily for 2 weeks, then stop the medication.

## 2013-12-20 NOTE — Telephone Encounter (Signed)
Patient's mother Thurston Holenne calling to get instructions on how to taper patient off of propanalol since she is doing well on her Topamax, please return call and advise.

## 2014-01-24 ENCOUNTER — Telehealth: Payer: Self-pay | Admitting: Neurology

## 2014-01-24 MED ORDER — TOPIRAMATE 50 MG PO TABS: 50.0000 mg | ORAL_TABLET | Freq: Two times a day (BID) | ORAL | Status: DC

## 2014-01-24 NOTE — Telephone Encounter (Signed)
The patient is on Topamax 25 mg twice daily, got some transient benefit with the tremor, I will go up on the dose to 50 mg twice daily dosing.

## 2014-01-24 NOTE — Telephone Encounter (Signed)
Patient's mother stated patient's Rx topiramate (TOPAMAX) 25 MG tablet worked well for about 2 weeks.  Tremors are more pronounced, questioning if dosage needs to be increased or if she needs to come in for appointment.  Please call and may leave message if not available.

## 2014-02-11 ENCOUNTER — Telehealth: Payer: Self-pay | Admitting: Neurology

## 2014-02-11 MED ORDER — CLONAZEPAM 0.25 MG PO TBDP: ORAL_TABLET | ORAL | Status: DC

## 2014-02-11 MED ORDER — TOPIRAMATE 25 MG PO TABS: 25.0000 mg | ORAL_TABLET | Freq: Two times a day (BID) | ORAL | Status: DC

## 2014-02-11 NOTE — Telephone Encounter (Signed)
Patient's mother is calling regarding patient. Patient's mother states since the patient has been taking Topomax 50mg  the patient has been having speech problems and the tremors are not well controlled. Please call and advise. It is ok to leave a message if unable to reach.Thank you.

## 2014-02-11 NOTE — Telephone Encounter (Signed)
I called the patient, left a message, I will call back later. 

## 2014-02-11 NOTE — Addendum Note (Signed)
Addended by: Stephanie AcreWILLIS, Fendi Meinhardt on: 02/11/2014 05:53 PM   Modules accepted: Orders, Medications

## 2014-02-11 NOTE — Telephone Encounter (Signed)
I called the patient, talk with the mother. The patient is having word finding problems on the higher doses of Topamax, without benefit with the tremor. We will reduce her back to 25 mg twice daily, potentially stopping medication in the future. The patient will be placed on low-dose clonazepam.

## 2014-02-14 NOTE — Telephone Encounter (Signed)
Patient's mother stated patient is experiencing weakness in knees.  Haven't initiated change in medicine as of yet, will begin that night.  FYI

## 2014-02-14 NOTE — Telephone Encounter (Signed)
I called again, left a message, I will call back tomorrow.

## 2014-02-14 NOTE — Telephone Encounter (Signed)
I called the patient, left a message. I'll call back later.

## 2014-02-16 NOTE — Telephone Encounter (Signed)
I called again, left another message. If the patient is having ongoing off arms, or the problems are increasing, we may need to see the patient back in office. They are to call us, and leave a number where they can be reached.

## 2014-03-13 ENCOUNTER — Encounter: Payer: Self-pay | Admitting: Neurology

## 2014-03-13 ENCOUNTER — Ambulatory Visit (INDEPENDENT_AMBULATORY_CARE_PROVIDER_SITE_OTHER): Payer: BC Managed Care – PPO | Admitting: Neurology

## 2014-03-13 VITALS — BP 110/75 | HR 89 | Ht 65.0 in | Wt 173.4 lb

## 2014-03-13 DIAGNOSIS — G252 Other specified forms of tremor: Principal | ICD-10-CM

## 2014-03-13 DIAGNOSIS — R251 Tremor, unspecified: Secondary | ICD-10-CM

## 2014-03-13 DIAGNOSIS — G250 Essential tremor: Secondary | ICD-10-CM

## 2014-03-13 MED ORDER — CLONAZEPAM 0.5 MG PO TABS: 0.5000 mg | ORAL_TABLET | Freq: Two times a day (BID) | ORAL | Status: DC

## 2014-03-13 NOTE — Progress Notes (Signed)
Reason for visit: Tremor  Jennifer [name] is an 23 y.o. female  History of present illness:  [name] is a 23 year old right-handed white female with a history of psychotic depression who is being evaluated for tremor. She continues to have significant issues with tremor that have worsened since coming off of the propranolol. Initially, she thought that the Topamax was quite effective for the tremor, but she has had some problems with cognitive side effects associated with word finding issues and stuttering speech. The patient is on 25 mg twice daily. She has developed some craving for certain foods such as fruits and fish. The patient was placed on low-dose clonazepam taking 0.25 mg twice daily. She has had a lot of problems with anxiety as well coming off of the propranolol. The patient has had 3 or 4 episodes where she feels as if her knees will collapse, and she may fall to the ground. The patient is on Lyrica taking 100 mg 3 times daily.  Past Medical History  Diagnosis Date  . Asthma   . Migraines     no aura  . Occasional tremors   . Environmental allergies     seasonal  . Fibromyalgia   . Memory loss   . Disturbance of skin sensation 12/04/2013    History reviewed. No pertinent past surgical history.  Family History  Problem Relation Age of Onset  . Breast cancer Mother     BRAC negative  age 21  . Diabetes Father   . Hypertension Father   . Thyroid disease Maternal Aunt   . Breast cancer Maternal Aunt 45    BRAC negative  . Thyroid disease Maternal Grandmother   . Alzheimer's disease Maternal Grandfather   . Lung cancer Paternal Grandmother   . Heart attack Paternal Grandfather   . Tremor Neg Hx     Social history:  reports that she has never smoked. She has never used smokeless tobacco. She reports that she does not drink alcohol or use illicit drugs.    Allergies  Allergen Reactions  . Ambien [Zolpidem Tartrate]     hallucinations    Medications:    Current Outpatient Prescriptions on File Prior to Visit  Medication Sig Dispense Refill  . ALBUTEROL IN Inhale into the lungs as needed.    . ARIPiprazole (ABILIFY) 10 MG tablet Take 5 mg by mouth daily.     Marland Kitchen. desogestrel-ethinyl estradiol (VELIVET,CAZIANT,CESIA,CYCLESSA) 0.1/0.125/0.15 -0.025 MG tablet Take 1 tablet by mouth daily. 3 Package 4  . fexofenadine (ALLEGRA) 180 MG tablet Take 180 mg by mouth as needed.     Marland Kitchen. FLUoxetine (PROZAC) 40 MG capsule Take 40 mg by mouth daily.    Marland Kitchen. LYRICA 100 MG capsule One by mouth 3 times a day 93 capsule 5   No current facility-administered medications on file prior to visit.    ROS:  Out of a complete 14 system review of symptoms, the patient complains only of the following symptoms, and all other reviewed systems are negative.  Appetite change Frequent waking Joint pain, walking difficulty Headache, speech difficulty, tremors Anxiety  Blood pressure 110/75, pulse 89, height 5\' 5"  (1.651 m), weight 173 lb 6.4 oz (78.654 kg).  Physical Exam  General: The patient is alert and cooperative at the time of the examination. The patient is minimally obese.  Skin: No significant peripheral edema is noted.   Neurologic Exam  Mental status: The patient is oriented x 3.  Cranial nerves: Facial symmetry is  present. Speech is normal, no aphasia or dysarthria is noted. Extraocular movements are full. Visual fields are full.  Motor: The patient has good strength in all 4 extremities.  Sensory examination: Soft touch sensation is decreased on the right arm and right leg relative to the left. Soft touch sensation on the face is symmetric.  Coordination: The patient has good finger-nose-finger and heel-to-shin bilaterally. The patient does have an intention tremor bilaterally with finger-nose-finger. No asterixis is seen.  Gait and station: The patient has a normal gait. Tandem gait is slightly unsteady. Romberg is negative. No drift is  seen.  Reflexes: Deep tendon reflexes are symmetric.   MRI brain 12/12/2013:  IMPRESSION:  Equivocal MRI brain (without) demonstrating: 1. Few punctate foci of non-specific gliosis in the periventricular and subcortical white matter. These findings are non-specific and considerations include autoimmune, inflammatory, post-infectious, microvascular ischemic or migraine associated etiologies.  2. No acute findings.   Assessment/Plan:  1. Tremor  2. Psychotic depression  The patient is having cognitive side effects on the Topamax. This medication will be tapered to 1 tablet daily for one week, and then the patient will stop the drug. The clonazepam will be increased to 0.5 mg twice daily to treat the tremor and increasing anxiety issues coming off of propranolol. The patient will follow-up in about 4 months. The family will contact me if the patient is not doing well on the increased dose of clonazepam. The patient has had episodes of falling that may be associated with asterixis with the use of Lyrica and Topamax.  Marlan Palau. Keith Kiyah Demartini MD 03/13/2014 7:45 PM  [facility] [address] [place], [place] [id number]  Phone [phone] Fax [phone]

## 2014-03-13 NOTE — Patient Instructions (Signed)

## 2014-04-12 ENCOUNTER — Telehealth: Payer: Self-pay | Admitting: Neurology

## 2014-04-12 MED ORDER — CLONAZEPAM 0.5 MG PO TABS: ORAL_TABLET | ORAL | Status: DC

## 2014-04-12 NOTE — Telephone Encounter (Signed)
Patient's mother is calling to let Dr. Anne HahnWillis know that the patient is still having episodes of crying and tremor is still not under control. Please call and advise. Thank you.

## 2014-04-12 NOTE — Telephone Encounter (Signed)
I called the patient, talk with the mother. The patient is waking up in the middle the night with tremors and crying spells. She is having some issues with anxiety in the middle the night, we will increase her clonazepam dose to 1 mg in the evening, 0.5 mg in the morning. I have written a new prescription for her.

## 2014-05-14 ENCOUNTER — Ambulatory Visit (HOSPITAL_COMMUNITY)
Admission: RE | Admit: 2014-05-14 | Discharge: 2014-05-14 | Disposition: A | Discharge status: Home / Self Care | Payer: BLUE CROSS/BLUE SHIELD | Source: Ambulatory Visit | Attending: Internal Medicine | Admitting: Internal Medicine

## 2014-05-14 ENCOUNTER — Other Ambulatory Visit (HOSPITAL_COMMUNITY): Payer: Self-pay | Admitting: Internal Medicine

## 2014-05-14 DIAGNOSIS — M24859 Other specific joint derangements of unspecified hip, not elsewhere classified: Secondary | ICD-10-CM

## 2014-05-14 DIAGNOSIS — M25552 Pain in left hip: Secondary | ICD-10-CM | POA: Diagnosis not present

## 2014-05-14 DIAGNOSIS — M25551 Pain in right hip: Secondary | ICD-10-CM | POA: Diagnosis not present

## 2014-06-03 ENCOUNTER — Telehealth: Payer: Self-pay | Admitting: Neurology

## 2014-06-03 NOTE — Telephone Encounter (Signed)
Patient's mother is calling and states her daughter is waking up at night and is very anxious to the point of crying.  Can he increase Klonpin to help as discussed.  Please call.

## 2014-06-03 NOTE — Telephone Encounter (Signed)
I called the mother. The patient is having episodes of waking up at night with anxiety, crying. They recently lost their family pet. The clonazepam dose was just recently increased to 0.5 mg in the morning and 1 mg at night. The patient is on Abilify and Prozac. I have asked them to contact their psychiatrist, Dr. Len Blalockavid Fuller for further management.

## 2014-06-05 ENCOUNTER — Telehealth: Payer: Self-pay | Admitting: Certified Nurse Midwife

## 2014-06-05 NOTE — Telephone Encounter (Signed)
Left message regarding upcoming appointment has been canceled and needs to be rescheduled. °

## 2014-06-12 ENCOUNTER — Telehealth: Payer: Self-pay

## 2014-06-12 NOTE — Telephone Encounter (Signed)
Left message for patient to call back to reschedule 5/16 appointment.

## 2014-06-17 NOTE — Telephone Encounter (Signed)
Left voicemail for patient to call back to reschedule May appointment.

## 2014-06-21 NOTE — Telephone Encounter (Signed)
Left voicemail asking patient to call back to reschedule 07/29/14 appointment. I also mailed a letter stating her appointment needs to be changed.

## 2014-07-03 ENCOUNTER — Other Ambulatory Visit: Payer: Self-pay | Admitting: Certified Nurse Midwife

## 2014-07-03 NOTE — Telephone Encounter (Signed)
Medication refill request: Velivet  Last AEX:  09/03/13 DL Next AEX: not scheduled  Last MMG (if hormonal medication request): none Refill authorized: 09/03/13 #3packs/ 4R to [facility].  [facility]   [facility] with no refills to last until 08/2014 as prescribed by DL 1/61/096/22/16.   Called patient, spoke with Jennifer [name] father - per DPR. States they will call back to schedule AEX. Advised them patient will need AEX for further refills. - Father verbalized understanding.   Routed to DL for review

## 2014-07-04 ENCOUNTER — Telehealth: Payer: Self-pay | Admitting: Certified Nurse Midwife

## 2014-07-04 NOTE — Telephone Encounter (Signed)
Pt's mom called to schedule aex appointment so she can have [facility]. Scheduled for 09/18/14 with Ms. [name].

## 2014-07-04 NOTE — Telephone Encounter (Signed)
Per Ms. Debbie patient should be okay until AEX with 3 months supply, routing to you Ms. Newt Minionebbie FYI. LM on patient's vm that rx has been sent to pharmacy.  Dion BodyReina C Beltran, CMA at 07/03/2014 8:46 AM     Status: Signed       Expand All Collapse All   Medication refill request: Velivet  Last AEX: 09/03/13 DL Next AEX: not scheduled  Last MMG (if hormonal medication request): none Refill authorized: 09/03/13 #3packs/ 4R to [facility].  [facility]   [facility] with no refills to last until 08/2014 as prescribed by DL 1/61/096/22/16.   Called patient, spoke with Toy CareEdward Siglin father - per DPR. States they will call back to schedule AEX. Advised them patient will need AEX for further refills. - Father verbalized understanding.   Routed to DL for review       Routed to provider for review, encounter closed.

## 2014-07-12 NOTE — Telephone Encounter (Signed)
Appointment scheduled for 09/23/14.

## 2014-07-29 ENCOUNTER — Ambulatory Visit: Payer: BC Managed Care – PPO | Admitting: Neurology

## 2014-09-05 ENCOUNTER — Ambulatory Visit: Payer: BC Managed Care – PPO | Admitting: Certified Nurse Midwife

## 2014-09-09 ENCOUNTER — Other Ambulatory Visit: Payer: Self-pay | Admitting: Certified Nurse Midwife

## 2014-09-09 NOTE — Telephone Encounter (Signed)
Medication refill request: Velivet  Last AEX:  09/04/14 with DL  Next AEX: 1/6/107/6/16 with DL Last MMG (if hormonal medication request): n/a Refill authorized: #3 packs/0 rfs  (Routed to PG since DL is out of office)

## 2014-09-18 ENCOUNTER — Encounter: Payer: Self-pay | Admitting: Certified Nurse Midwife

## 2014-09-18 ENCOUNTER — Ambulatory Visit (INDEPENDENT_AMBULATORY_CARE_PROVIDER_SITE_OTHER): Payer: BLUE CROSS/BLUE SHIELD | Admitting: Certified Nurse Midwife

## 2014-09-18 VITALS — BP 94/62 | HR 68 | Resp 16 | Ht 64.75 in | Wt 169.0 lb

## 2014-09-18 DIAGNOSIS — Z124 Encounter for screening for malignant neoplasm of cervix: Secondary | ICD-10-CM

## 2014-09-18 DIAGNOSIS — Z01419 Encounter for gynecological examination (general) (routine) without abnormal findings: Secondary | ICD-10-CM | POA: Diagnosis not present

## 2014-09-18 DIAGNOSIS — N644 Mastodynia: Secondary | ICD-10-CM | POA: Diagnosis not present

## 2014-09-18 DIAGNOSIS — Z3041 Encounter for surveillance of contraceptive pills: Secondary | ICD-10-CM | POA: Diagnosis not present

## 2014-09-18 MED ORDER — DESOGEST-ETH ESTRAD TRIPHASIC 0.1/0.125/0.15 -0.025 MG PO TABS: ORAL_TABLET | ORAL | Status: DC

## 2014-09-18 NOTE — Patient Instructions (Signed)
General topics  Next pap or exam is  due in 1 year Take a Women's multivitamin Take 1200 mg. of calcium daily - prefer dietary If any concerns in interim to call back  Breast Self-Awareness Practicing breast self-awareness may pick up problems early, prevent significant medical complications, and possibly save your life. By practicing breast self-awareness, you can become familiar with how your breasts look and feel and if your breasts are changing. This allows you to notice changes early. It can also offer you some reassurance that your breast health is good. One way to learn what is normal for your breasts and whether your breasts are changing is to do a breast self-exam. If you find a lump or something that was not present in the past, it is best to contact your caregiver right away. Other findings that should be evaluated by your caregiver include nipple discharge, especially if it is bloody; skin changes or reddening; areas where the skin seems to be pulled in (retracted); or new lumps and bumps. Breast pain is seldom associated with cancer (malignancy), but should also be evaluated by a caregiver. BREAST SELF-EXAM The best time to examine your breasts is 5 7 days after your menstrual period is over.  ExitCare Patient Information 2013 ExitCare, LLC.   Exercise to Stay Healthy Exercise helps you become and stay healthy. EXERCISE IDEAS AND TIPS Choose exercises that:  You enjoy.  Fit into your day. You do not need to exercise really hard to be healthy. You can do exercises at a slow or medium level and stay healthy. You can:  Stretch before and after working out.  Try yoga, Pilates, or tai chi.  Lift weights.  Walk fast, swim, jog, run, climb stairs, bicycle, dance, or rollerskate.  Take aerobic classes. Exercises that burn about 150 calories:  Running 1  miles in 15 minutes.  Playing volleyball for 45 to 60 minutes.  Washing and waxing a car for 45 to 60  minutes.  Playing touch football for 45 minutes.  Walking 1  miles in 35 minutes.  Pushing a stroller 1  miles in 30 minutes.  Playing basketball for 30 minutes.  Raking leaves for 30 minutes.  Bicycling 5 miles in 30 minutes.  Walking 2 miles in 30 minutes.  Dancing for 30 minutes.  Shoveling snow for 15 minutes.  Swimming laps for 20 minutes.  Walking up stairs for 15 minutes.  Bicycling 4 miles in 15 minutes.  Gardening for 30 to 45 minutes.  Jumping rope for 15 minutes.  Washing windows or floors for 45 to 60 minutes. Document Released: 04/03/2010 Document Revised: 05/24/2011 Document Reviewed: 04/03/2010 ExitCare Patient Information 2013 ExitCare[employer] LLC.   Other topics ( that may be useful information):    Sexually Transmitted Disease Sexually transmitted disease (STD) refers to any infection that is passed from person to person during sexual activity. This may happen by way of saliva, semen, blood, vaginal mucus, or urine. Common STDs include:  Gonorrhea.  Chlamydia.  Syphilis.  HIV/AIDS.  Genital herpes.  Hepatitis B and C.  Trichomonas.  Human papillomavirus (HPV).  Pubic lice. CAUSES  An STD may be spread by bacteria, virus, or parasite. A person can get an STD by:  Sexual intercourse with an infected person.  Sharing sex toys with an infected person.  Sharing needles with an infected person.  Having intimate contact with the genitals, mouth, or rectal areas of an infected person. SYMPTOMS  Some people may not have any symptoms, but   they can still pass the infection to others. Different STDs have different symptoms. Symptoms include:  Painful or bloody urination.  Pain in the pelvis, abdomen, vagina, anus, throat, or eyes.  Skin rash, itching, irritation, growths, or sores (lesions). These usually occur in the genital or anal area.  Abnormal vaginal discharge.  Penile discharge in men.  Soft, flesh-colored skin growths in the  genital or anal area.  Fever.  Pain or bleeding during sexual intercourse.  Swollen glands in the groin area.  Yellow skin and eyes (jaundice). This is seen with hepatitis. DIAGNOSIS  To make a diagnosis, your caregiver may:  Take a medical history.  Perform a physical exam.  Take a specimen (culture) to be examined.  Examine a sample of discharge under a microscope.  Perform blood test TREATMENT   Chlamydia, gonorrhea, trichomonas, and syphilis can be cured with antibiotic medicine.  Genital herpes, hepatitis, and HIV can be treated, but not cured, with prescribed medicines. The medicines will lessen the symptoms.  Genital warts from HPV can be treated with medicine or by freezing, burning (electrocautery), or surgery. Warts may come back.  HPV is a virus and cannot be cured with medicine or surgery.However, abnormal areas may be followed very closely by your caregiver and may be removed from the cervix, vagina, or vulva through office procedures or surgery. If your diagnosis is confirmed, your recent sexual partners need treatment. This is true even if they are symptom-free or have a negative culture or evaluation. They should not have sex until their caregiver says it is okay. HOME CARE INSTRUCTIONS  All sexual partners should be informed, tested, and treated for all STDs.  Take your antibiotics as directed. Finish them even if you start to feel better.  Only take over-the-counter or prescription medicines for pain, discomfort, or fever as directed by your caregiver.  Rest.  Eat a balanced diet and drink enough fluids to keep your urine clear or pale yellow.  Do not have sex until treatment is completed and you have followed up with your caregiver. STDs should be checked after treatment.  Keep all follow-up appointments, Pap tests, and blood tests as directed by your caregiver.  Only use latex condoms and water-soluble lubricants during sexual activity. Do not use  petroleum jelly or oils.  Avoid alcohol and illegal drugs.  Get vaccinated for HPV and hepatitis. If you have not received these vaccines in the past, talk to your caregiver about whether one or both might be right for you.  Avoid risky sex practices that can break the skin. The only way to avoid getting an STD is to avoid all sexual activity.Latex condoms and dental dams (for oral sex) will help lessen the risk of getting an STD, but will not completely eliminate the risk. SEEK MEDICAL CARE IF:   You have a fever.  You have any new or worsening symptoms. Document Released: 05/22/2002 Document Revised: 05/24/2011 Document Reviewed: 05/29/2010 [facility] Patient Information 2013 [place].    Domestic Abuse You are being battered or abused if someone close to you hits, pushes, or physically hurts you in any way. You also are being abused if you are forced into activities. You are being sexually abused if you are forced to have sexual contact of any kind. You are being emotionally abused if you are made to feel worthless or if you are constantly threatened. It is important to remember that help is available. No one has the right to abuse you. PREVENTION OF FURTHER  ABUSE  Learn the warning signs of danger. This varies with situations but may include: the use of alcohol, threats, isolation from friends and family, or forced sexual contact. Leave if you feel that violence is going to occur.  If you are attacked or beaten, report it to the police so the abuse is documented. You do not have to press charges. The police can protect you while you or the attackers are leaving. Get the officer's name and badge number and a copy of the report.  Find someone you can trust and tell them what is happening to you: your caregiver, a nurse, clergy member, close friend or family member. Feeling ashamed is natural, but remember that you have done nothing wrong. No one deserves abuse. Document Released:  02/27/2000 Document Revised: 05/24/2011 Document Reviewed: 05/07/2010 ExitCare Patient Information 2013 ExitCare, LLC.    How Much is Too Much Alcohol? Drinking too much alcohol can cause injury, accidents, and health problems. These types of problems can include:   Car crashes.  Falls.  Family fighting (domestic violence).  Drowning.  Fights.  Injuries.  Burns.  Damage to certain organs.  Having a baby with birth defects. ONE DRINK CAN BE TOO MUCH WHEN YOU ARE:  Working.  Pregnant or breastfeeding.  Taking medicines. Ask your doctor.  Driving or planning to drive. If you or someone you know has a drinking problem, get help from a doctor.  Document Released: 12/26/2008 Document Revised: 05/24/2011 Document Reviewed: 12/26/2008 ExitCare Patient Information 2013 ExitCare, LLC.   Smoking Hazards Smoking cigarettes is extremely bad for your health. Tobacco smoke has over 200 known poisons in it. There are over 60 chemicals in tobacco smoke that cause cancer. Some of the chemicals found in cigarette smoke include:   Cyanide.  Benzene.  Formaldehyde.  Methanol (wood alcohol).  Acetylene (fuel used in welding torches).  Ammonia. Cigarette smoke also contains the poisonous gases nitrogen oxide and carbon monoxide.  Cigarette smokers have an increased risk of many serious medical problems and Smoking causes approximately:  90% of all lung cancer deaths in men.  80% of all lung cancer deaths in women.  90% of deaths from chronic obstructive lung disease. Compared with nonsmokers, smoking increases the risk of:  Coronary heart disease by 2 to 4 times.  Stroke by 2 to 4 times.  Men developing lung cancer by 23 times.  Women developing lung cancer by 13 times.  Dying from chronic obstructive lung diseases by 12 times.  . Smoking is the most preventable cause of death and disease in our society.  WHY IS SMOKING ADDICTIVE?  Nicotine is the chemical  agent in tobacco that is capable of causing addiction or dependence.  When you smoke and inhale, nicotine is absorbed rapidly into the bloodstream through your lungs. Nicotine absorbed through the lungs is capable of creating a powerful addiction. Both inhaled and non-inhaled nicotine may be addictive.  Addiction studies of cigarettes and spit tobacco show that addiction to nicotine occurs mainly during the teen years, when young people begin using tobacco products. WHAT ARE THE BENEFITS OF QUITTING?  There are many health benefits to quitting smoking.   Likelihood of developing cancer and heart disease decreases. Health improvements are seen almost immediately.  Blood pressure, pulse rate, and breathing patterns start returning to normal soon after quitting. QUITTING SMOKING   American Lung Association - 1-800-LUNGUSA  American Cancer Society - 1-800-ACS-2345 Document Released: 04/08/2004 Document Revised: 05/24/2011 Document Reviewed: 12/11/2008 ExitCare Patient Information 2013 ExitCare,   LLC.   Stress Management Stress is a state of physical or mental tension that often results from changes in your life or normal routine. Some common causes of stress are:  Death of a loved one.  Injuries or severe illnesses.  Getting fired or changing jobs.  Moving into a new home. Other causes may be:  Sexual problems.  Business or financial losses.  Taking on a large debt.  Regular conflict with someone at home or at work.  Constant tiredness from lack of sleep. It is not just bad things that are stressful. It may be stressful to:  Win the lottery.  Get married.  Buy a new car. The amount of stress that can be easily tolerated varies from person to person. Changes generally cause stress, regardless of the types of change. Too much stress can affect your health. It may lead to physical or emotional problems. Too little stress (boredom) may also become stressful. SUGGESTIONS TO  REDUCE STRESS:  Talk things over with your family and friends. It often is helpful to share your concerns and worries. If you feel your problem is serious, you may want to get help from a professional counselor.  Consider your problems one at a time instead of lumping them all together. Trying to take care of everything at once may seem impossible. List all the things you need to do and then start with the most important one. Set a goal to accomplish 2 or 3 things each day. If you expect to do too many in a single day you will naturally fail, causing you to feel even more stressed.  Do not use alcohol or drugs to relieve stress. Although you may feel better for a short time, they do not remove the problems that caused the stress. They can also be habit forming.  Exercise regularly - at least 3 times per week. Physical exercise can help to relieve that "uptight" feeling and will relax you.  The shortest distance between despair and hope is often a good night's sleep.  Go to bed and get up on time allowing yourself time for appointments without being rushed.  Take a short "time-out" period from any stressful situation that occurs during the day. Close your eyes and take some deep breaths. Starting with the muscles in your face, tense them, hold it for a few seconds, then relax. Repeat this with the muscles in your neck, shoulders, hand, stomach, back and legs.  Take good care of yourself. Eat a balanced diet and get plenty of rest.  Schedule time for having fun. Take a break from your daily routine to relax. HOME CARE INSTRUCTIONS   Call if you feel overwhelmed by your problems and feel you can no longer manage them on your own.  Return immediately if you feel like hurting yourself or someone else. Document Released: 08/25/2000 Document Revised: 05/24/2011 Document Reviewed: 04/17/2007 ExitCare Patient Information 2013 ExitCare, LLC.   

## 2014-09-18 NOTE — Progress Notes (Signed)
Patient scheduled for [facility] for 09[facility]23/14 [facility] at 1320. Mother aware.

## 2014-09-18 NOTE — Progress Notes (Signed)
24 y.o. G0P0000 Single  Caucasian Fe here for annual exam. Periods normal, no issues. Periods much better with OCP use. Desires continuance. Sees PCP yearly with labs and asthma management.Luberta Robertson. Sees Dr.Hickley prn for neurological issues. No problems in the past year with. Dr. Toni ArthursFuller manages pschyatry medications. No concerns with ventricularperitoneal shunt. Patient continues to have soreness and tenderness in left breast. Some during period, but continues after period. Notices it feels better with bra removal, but then reoccurs. No excessive caffeine use or injury to breast. Family history of mother(BRAC neg) of breast cancer. Patient is concerned about this, but not worrying. No other health issues today.  Patient's last menstrual period was 08/19/2014.          Sexually active: No.  The current method of family planning is OCP (estrogen/progesterone).    Exercising: No.  exercise Smoker:  no  Health Maintenance: Pap:  none MMG:  none Colonoscopy:  none BMD:   none TDaP: 2008 Labs: none Self breast exam: done monthly   reports that she has never smoked. She has never used smokeless tobacco. She reports that she does not drink alcohol or use illicit drugs.  Past Medical History  Diagnosis Date  . Asthma   . Migraines     no aura  . Occasional tremors   . Environmental allergies     seasonal  . Fibromyalgia   . Memory loss   . Disturbance of skin sensation 12/04/2013    History reviewed. No pertinent past surgical history.  Current Outpatient Prescriptions  Medication Sig Dispense Refill  . ALBUTEROL IN Inhale into the lungs as needed.    . ARIPiprazole (ABILIFY) 10 MG tablet Take 5 mg by mouth daily.     Marland Kitchen. desogestrel-ethinyl estradiol (VELIVET) 0.1/0.125/0.15 -0.025 MG tablet TAKE 1 TABLET DAILY 84 tablet 4  . fexofenadine (ALLEGRA) 180 MG tablet Take 180 mg by mouth as needed.     Marland Kitchen. FLUoxetine (PROZAC) 40 MG capsule Take 40 mg by mouth daily.    Marland Kitchen. LYRICA 100 MG capsule One by  mouth 3 times a day 93 capsule 5  . PROAIR HFA 108 (90 BASE) MCG/ACT inhaler as needed.    . propranolol (INDERAL) 20 MG tablet Take 20 mg by mouth. Take 1/2 in the am & 1 at night     No current facility-administered medications for this visit.    Family History  Problem Relation Age of Onset  . Breast cancer Mother     BRAC negative  age 22  . Diabetes Father   . Hypertension Father   . Thyroid disease Maternal Aunt   . Breast cancer Maternal Aunt 45    BRAC negative  . Thyroid disease Maternal Grandmother   . Alzheimer's disease Maternal Grandfather   . Lung cancer Paternal Grandmother   . Heart attack Paternal Grandfather   . Tremor Neg Hx     ROS:  Pertinent items are noted in HPI.  Otherwise, a comprehensive ROS was negative.  Exam:   BP 94/62 mmHg  Pulse 68  Resp 16  Ht 5' 4.75" (1.645 m)  Wt 169 lb (76.658 kg)  BMI 28.33 kg/m2  LMP 08/19/2014 Height: 5' 4.75" (164.5 cm) Ht Readings from Last 3 Encounters:  09/18/14 5' 4.75" (1.645 m)  03/13/14 5\' 5"  (1.651 m)  12/04/13 5\' 5"  (1.651 m)    General appearance: alert, cooperative and appears stated age Head: Normocephalic, without obvious abnormality, atraumatic Neck: no adenopathy, supple, symmetrical, trachea midline and thyroid  normal to inspection and palpation Lungs: clear to auscultation bilaterally Breasts: normal appearance, no masses or tenderness, No nipple retraction or dimpling, No nipple discharge or bleeding, No axillary or supraclavicular adenopathy, tenderness at 12-1 o'clock, no masses noted. Heart: regular rate and rhythm Abdomen: soft, non-tender; no masses,  no organomegaly Extremities: extremities normal, atraumatic, no cyanosis or edema Skin: Skin color, texture, turgor normal. No rashes or lesions Lymph nodes: Cervical, supraclavicular, and axillary nodes normal. No abnormal inguinal nodes palpated Neurologic: Grossly normal   Pelvic: External genitalia:  no lesions               Urethra:  normal appearing urethra with no masses, tenderness or lesions              Bartholin's and Skene's: normal                 Vagina: normal appearing vagina with normal color and discharge, no lesions              Cervix: normal, non tender,no lesions              Pap taken: Yes.   Bimanual Exam:  Uterus:  normal size, contour, position, consistency, mobility, non-tender and anteverted              Adnexa: normal adnexa and no mass, fullness, tenderness               Rectovaginal: Confirms               Anus:  normal appearance  Chaperone present: Yes  A:  Well Woman with normal exam  Contraception none needed, not sexually active  Breast tenderness left breast  Dysthymic disorder with neurology management    P:   Reviewed health and wellness pertinent to exam  Will advise if status changes  Discussed no mass noted but feel evaluation with [place] due to concern and patient's family history of breast cancer. Mother with patient, ROI signed.  Mother agreeable to plan. Patient will be scheduled today, prior to leaving.  Continue follow up with MD as indicated  Pap smear taken with HPV reflex .  Counseled on breast self exam, STD prevention, HIV risk factors and prevention, adequate intake of calcium and vitamin D, diet and exercise.  return annually or prn  An After Visit Summary was printed and given to the patient.

## 2014-09-19 NOTE — Progress Notes (Signed)
Reviewed personally.  M. Suzanne Decklyn Hornik, MD.  

## 2014-09-20 NOTE — Addendum Note (Signed)
Addended by: Verner CholLEONARD, Lamontae Ricardo S on: 09/20/2014 04:54 PM   Modules accepted: Orders, SmartSet

## 2014-09-23 ENCOUNTER — Encounter: Payer: Self-pay | Admitting: Neurology

## 2014-09-23 ENCOUNTER — Ambulatory Visit
Admission: RE | Admit: 2014-09-23 | Discharge: 2014-09-23 | Disposition: A | Discharge status: Home / Self Care | Payer: BLUE CROSS/BLUE SHIELD | Source: Ambulatory Visit | Attending: Certified Nurse Midwife | Admitting: Certified Nurse Midwife

## 2014-09-23 ENCOUNTER — Ambulatory Visit (INDEPENDENT_AMBULATORY_CARE_PROVIDER_SITE_OTHER): Payer: BLUE CROSS/BLUE SHIELD | Admitting: Neurology

## 2014-09-23 VITALS — BP 98/68 | HR 84 | Ht 66.0 in | Wt 172.2 lb

## 2014-09-23 DIAGNOSIS — R251 Tremor, unspecified: Secondary | ICD-10-CM | POA: Diagnosis not present

## 2014-09-23 DIAGNOSIS — G252 Other specified forms of tremor: Principal | ICD-10-CM

## 2014-09-23 DIAGNOSIS — G250 Essential tremor: Secondary | ICD-10-CM

## 2014-09-23 DIAGNOSIS — N644 Mastodynia: Secondary | ICD-10-CM

## 2014-09-23 DIAGNOSIS — M797 Fibromyalgia: Secondary | ICD-10-CM

## 2014-09-23 DIAGNOSIS — G219 Secondary parkinsonism, unspecified: Secondary | ICD-10-CM

## 2014-09-23 HISTORY — DX: Secondary parkinsonism, unspecified: G21.9

## 2014-09-23 MED ORDER — BENZTROPINE MESYLATE 0.5 MG PO TABS
0.5000 mg | ORAL_TABLET | Freq: Two times a day (BID) | ORAL | Status: DC
Start: 2014-09-23 — End: 2015-01-22

## 2014-09-23 NOTE — Progress Notes (Addendum)
Reason for visit: Tremor  Jennifer [name] is an 24 y.o. female  History of present illness:  [name] is a 24 year old right-handed white female with a history of a tremor affecting the upper extremities. The patient has a history of psychosis and severe anxiety. The patient has been on Abilify taking 5 mg at night. The patient was placed on clonazepam for the tremor and for her anxiety, but this did not work as well as propranolol, and she was switched back to propranolol taking 20 mg in the morning and 40 mg in the evening. She is doing better at night with her anxiety issues. She is off of the clonazepam at this time. The patient reports that when she goes to bed at night, the tremors may increase and keep her from getting to sleep. She has resting tremors as well as intention tremors. She denies any significant gait problems, no falls. She returns to this office for an evaluation.  Past Medical History  Diagnosis Date  . Asthma   . Migraines     no aura  . Occasional tremors   . Environmental allergies     seasonal  . Fibromyalgia   . Memory loss   . Disturbance of skin sensation 12/04/2013  . 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 No pertinent past surgical history.  Family History  Problem Relation Age of Onset  . Breast cancer Mother     BRAC negative  age 22  . Diabetes Father   . Hypertension Father   . Thyroid disease Maternal Aunt   . Breast cancer Maternal Aunt 45    BRAC negative  . Thyroid disease Maternal Grandmother   . Alzheimer's disease Maternal Grandfather   . Lung cancer Paternal Grandmother   . Heart attack Paternal Grandfather   . Tremor Neg Hx     Social history:  reports that she has never smoked. She has never used smokeless tobacco. She reports that she does not drink alcohol or use illicit drugs.    Allergies  Allergen Reactions  . Ambien [Zolpidem Tartrate]     hallucinations  . Topamax [Topiramate]     Cognitive side  effects    Medications:  Prior to Admission medications   Medication Sig Start Date End Date Taking? Authorizing Provider  ALBUTEROL IN Inhale into the lungs as needed.   Yes Historical Provider, MD  ARIPiprazole (ABILIFY) 10 MG tablet Take 5 mg by mouth daily.    Yes Historical Provider, MD  desogestrel-ethinyl estradiol (VELIVET) 0.1/0.125/0.15 -0.025 MG tablet TAKE 1 TABLET DAILY 09/18/14  Yes Verner Choleborah S Leonard, CNM  fexofenadine (ALLEGRA) 180 MG tablet Take 180 mg by mouth as needed.    Yes Historical Provider, MD  FLUoxetine (PROZAC) 40 MG capsule Take 40 mg by mouth daily.   Yes Historical Provider, MD  LYRICA 100 MG capsule One by mouth 3 times a day 01/09/13  Yes Deetta PerlaWilliam H Hickling, MD  PROAIR HFA 108 (90 BASE) MCG/ACT inhaler as needed. 07/22/14  Yes Historical Provider, MD  propranolol (INDERAL) 20 MG tablet Take 20 mg by mouth. Take 1 in the am & 2 at night   Yes Historical Provider, MD    ROS:  Out of a complete 14 system review of symptoms, the patient complains only of the following symptoms, and all other reviewed systems are negative.  Joint pain, achy muscles Headache Tremors  Blood pressure 98/68, pulse 84, height 5\' 6"  (1.676 m), weight 172 lb 3.2  oz (78.109 kg), last menstrual period 08/19/2014.  Physical Exam  General: The patient is alert and cooperative at the time of the examination.  Skin: No significant peripheral edema is noted.   Neurologic Exam  Mental status: The patient is alert and oriented x 2 at the time of the examination (the patient is not oriented to date). The patient has apparent normal recent and remote memory, with an apparently normal attention span and concentration ability. The Mini-Mental Status Examination done today shows a total score 24/30. The patient is able to name 12 animals in 30 seconds.   Cranial nerves: Facial symmetry is present. Speech is normal, no aphasia or dysarthria is noted. Extraocular movements are full. Visual  fields are full.  Motor: The patient has good strength in all 4 extremities.  Sensory examination: Soft touch sensation is symmetric on the face arms, but is slightly decreased on the right arm and leg.  Coordination: The patient has good finger-nose-finger and heel-to-shin bilaterally. The patient does have an intention tremor with finger-nose-finger bilaterally.  Gait and station: The patient has a normal gait. Tandem gait is slightly unsteady. Romberg is negative. No drift is seen.  Reflexes: Deep tendon reflexes are symmetric, but are somewhat brisk throughout.   Assessment/Plan:  1. Psychosis, anxiety disorder   2. Essential tremor  3. Mild secondary parkinsonism  The patient likely has 2 different types of tremors, she has an intention tremor but also reports a resting tremor that is oftentimes worse before trying to get to sleep at night. The patient will be placed on low-dose Cogentin for this taking 0.5 mg tablets twice daily. She will contact us if she is having side effects on the drug. She will follow-up through this office in 6 months, or sooner if needed.  Marlan Palau MD 09/23/2014 6:12 PM  [facility] [address] [place], [place] [address]  Phone [phone] Fax [phone]

## 2014-09-23 NOTE — Patient Instructions (Signed)

## 2014-09-24 ENCOUNTER — Telehealth: Payer: Self-pay

## 2014-09-24 LAB — IPS PAP TEST WITH REFLEX TO HPV

## 2014-09-24 NOTE — Telephone Encounter (Signed)
Left message to call Nashika Coker at [phone]. 

## 2014-09-24 NOTE — Telephone Encounter (Signed)
-----   Message from Verner Choleborah S Leonard, CNM sent at 09/24/2014  7:35 AM EDT ----- Notify patient that I have reviewed left breast US report which is normal findings. We discussed I feel this tenderness is menses related, if continues would need to recheck in office. I discussed this concern with mother when patient was here. She may want to try a different bra, to see this helps.

## 2014-09-26 NOTE — Telephone Encounter (Signed)
Left message to call Tanishka Drolet at [phone]. 

## 2014-10-01 NOTE — Telephone Encounter (Signed)
Left message to call Jennifer [name] at [phone]. 

## 2014-10-03 NOTE — Telephone Encounter (Signed)
Left message to call Devani Odonnel at [phone]. 

## 2014-10-03 NOTE — Telephone Encounter (Signed)
Returning a call to Jennifer [name]. °

## 2014-10-09 NOTE — Telephone Encounter (Signed)
-----   Message from Jerene Bears, MD sent at 10/07/2014 12:05 PM EDT ----- Regarding: RE: Mammogram hold  OK to remove from hold.  Yvonna Alanis has tried to call her several times.  She needs a letter and then removal from in box.  I can sign letter or write it if needed.  CC:  Kaitlyn.  MSM ----- Message -----    From: Joeseph Amor, RN    Sent: 10/07/2014  11:57 AM      To: Jerene Bears, MD Subject: Mammogram hold                                 Dr. Hyacinth Meeker,  Can you review breast imaging from the breast center and advise if okay to remove from hold?

## 2014-10-10 NOTE — Telephone Encounter (Signed)
Letter written and to Dr.Miller's desk for review and signature before mailing.

## 2014-10-11 NOTE — Telephone Encounter (Signed)
Signed letter by Dr.Miller sent to address on file. Will close encounter.  Cc: Almedia Balls, RN  Routing to provider for final review. Patient agreeable to disposition. Will close encounter.

## 2014-12-30 ENCOUNTER — Telehealth: Payer: Self-pay | Admitting: Neurology

## 2014-12-30 NOTE — Telephone Encounter (Signed)
Pt's mother called and states she has noticied a facial twitch the last couple of weeks. She wants to know if she needs to make appt or what she should do. Please call and advise [phone], Father will answer the phone.

## 2014-12-30 NOTE — Telephone Encounter (Signed)
I called the patient, left a message. I will call back tomorrow. The patient is on Abilify, may be at risk for tardive movements.

## 2014-12-30 NOTE — Telephone Encounter (Signed)
I called and spoke to the patient's father. He stated that for the past couple of weeks the patient's eyebrows twitch and make a surprised look a lot. The patient does not know she is doing this when it happens. He wondered if this could be from the Cogentin she recently started or if they should bring her in for an appointment. I advised I would check with Dr. Anne HahnWillis and call them back.

## 2014-12-31 NOTE — Telephone Encounter (Signed)
I called the patient's father. Appointment scheduled for 10/25.

## 2014-12-31 NOTE — Telephone Encounter (Signed)
I called the patient, talk with the father. The patient has developed what sounds like a motor tic problem with sudden elevation of the eyebrows. This is an occasional event, is not extremely frequent. We will set up a revisit. We may consider use of clonazepam if the events are frequent. The Cogentin has helped the tremors from the Abilify.

## 2015-01-01 ENCOUNTER — Emergency Department (HOSPITAL_COMMUNITY)
Admission: EM | Admit: 2015-01-01 | Discharge: 2015-01-01 | Disposition: A | Discharge status: Home / Self Care | Payer: BLUE CROSS/BLUE SHIELD | Attending: Emergency Medicine | Admitting: Emergency Medicine

## 2015-01-01 ENCOUNTER — Encounter (HOSPITAL_COMMUNITY): Payer: Self-pay

## 2015-01-01 DIAGNOSIS — R42 Dizziness and giddiness: Secondary | ICD-10-CM | POA: Insufficient documentation

## 2015-01-01 DIAGNOSIS — Z3202 Encounter for pregnancy test, result negative: Secondary | ICD-10-CM | POA: Diagnosis not present

## 2015-01-01 DIAGNOSIS — R110 Nausea: Secondary | ICD-10-CM | POA: Diagnosis not present

## 2015-01-01 DIAGNOSIS — Z79899 Other long term (current) drug therapy: Secondary | ICD-10-CM | POA: Diagnosis not present

## 2015-01-01 DIAGNOSIS — Z793 Long term (current) use of hormonal contraceptives: Secondary | ICD-10-CM | POA: Insufficient documentation

## 2015-01-01 DIAGNOSIS — G43909 Migraine, unspecified, not intractable, without status migrainosus: Secondary | ICD-10-CM | POA: Diagnosis not present

## 2015-01-01 DIAGNOSIS — E860 Dehydration: Secondary | ICD-10-CM | POA: Diagnosis not present

## 2015-01-01 DIAGNOSIS — J45909 Unspecified asthma, uncomplicated: Secondary | ICD-10-CM | POA: Diagnosis not present

## 2015-01-01 DIAGNOSIS — G219 Secondary parkinsonism, unspecified: Secondary | ICD-10-CM | POA: Insufficient documentation

## 2015-01-01 DIAGNOSIS — M797 Fibromyalgia: Secondary | ICD-10-CM | POA: Insufficient documentation

## 2015-01-01 LAB — URINALYSIS, ROUTINE W REFLEX MICROSCOPIC
BILIRUBIN URINE: NEGATIVE
Glucose, UA: NEGATIVE mg/dL
Hgb urine dipstick: NEGATIVE
Ketones, ur: NEGATIVE mg/dL
NITRITE: NEGATIVE
PH: 7.5 (ref 5.0–8.0)
Protein, ur: NEGATIVE mg/dL
SPECIFIC GRAVITY, URINE: 1.015 (ref 1.005–1.030)
Urobilinogen, UA: 0.2 mg/dL (ref 0.0–1.0)

## 2015-01-01 LAB — URINE MICROSCOPIC-ADD ON

## 2015-01-01 LAB — PREGNANCY, URINE: Preg Test, Ur: NEGATIVE

## 2015-01-01 NOTE — ED Provider Notes (Addendum)
CSN: [id number]     Arrival date & time 01/01/15  1643 History   First MD Initiated Contact with Patient 01/01/15 1738     Chief Complaint  Patient presents with  . Altered Mental Status     (Consider location/radiation/quality/duration/timing/severity/associated sxs/prior Treatment) HPI Comments: Walking around outside in the heat, try Felt warm and nauseas, laid down, felt like going to pass out, has felt this way before, a few years ago--if not drinking enough water, lightheaded, medications 4:15 EMS picked her up Symptoms improved after coming to ED  Not drinking as much today, snacks, one glass of milk   Patient is a 24 y.o. female presenting with altered mental status.  Altered Mental Status Associated symptoms: headaches (began slowly mild, similar to past HA), light-headedness and nausea   Associated symptoms: no abdominal pain, no fever, no rash, no vomiting and no weakness     Past Medical History  Diagnosis Date  . Asthma   . Migraines     no aura  . Occasional tremors   . Environmental allergies     seasonal  . Fibromyalgia   . Memory loss   . Disturbance of skin sensation 12/04/2013  . 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 No pertinent past surgical history. Family History  Problem Relation Age of Onset  . Breast cancer Mother     BRAC negative  age 22  . Diabetes Father   . Hypertension Father   . Thyroid disease Maternal Aunt   . Breast cancer Maternal Aunt 45    BRAC negative  . Thyroid disease Maternal Grandmother   . Alzheimer's disease Maternal Grandfather   . Lung cancer Paternal Grandmother   . Heart attack Paternal Grandfather   . Tremor Neg Hx    Social History  Substance Use Topics  . Smoking status: Never Smoker   . Smokeless tobacco: Never Used  . Alcohol Use: No   OB History    Gravida Para Term Preterm AB TAB SAB Ectopic Multiple Living   0 0 0 0 0 0 0 0 0 0      Review of Systems  Constitutional: Negative  for fever.  HENT: Negative for congestion.   Eyes: Negative for visual disturbance.  Respiratory: Negative for cough and shortness of breath.   Cardiovascular: Negative for chest pain.  Gastrointestinal: Positive for nausea. Negative for vomiting, abdominal pain and diarrhea.  Genitourinary: Negative for dysuria and difficulty urinating.  Musculoskeletal: Negative for back pain.  Skin: Negative for rash.  Neurological: Positive for light-headedness and headaches (began slowly mild, similar to past HA). Negative for syncope, facial asymmetry, speech difficulty, weakness and numbness.      Allergies  Ambien and Topamax  Home Medications   Prior to Admission medications   Medication Sig Start Date End Date Taking? Authorizing Provider  ARIPiprazole (ABILIFY) 5 MG tablet Take 5 mg by mouth daily.   Yes Historical Provider, MD  benztropine (COGENTIN) 0.5 MG tablet Take 1 tablet (0.5 mg total) by mouth 2 (two) times daily. 09/23/14  Yes York Spanielharles K Willis, MD  desogestrel-ethinyl estradiol (VELIVET) 0.1/0.125/0.15 -0.025 MG tablet TAKE 1 TABLET DAILY 09/18/14  Yes Verner Choleborah S Leonard, CNM  FLUoxetine (PROZAC) 40 MG capsule Take 40 mg by mouth daily.   Yes Historical Provider, MD  LYRICA 100 MG capsule One by mouth 3 times a day Patient taking differently: Take 100 mg by mouth 3 (three) times daily.  01/09/13  Yes Deetta PerlaWilliam H Hickling, MD  propranolol (INDERAL)  20 MG tablet Take 20 mg by mouth daily.    Yes Historical Provider, MD  propranolol (INDERAL) 40 MG tablet Take 40 mg by mouth at bedtime. 12/22/14  Yes Historical Provider, MD  fexofenadine (ALLEGRA) 180 MG tablet Take 180 mg by mouth as needed.     Historical Provider, MD  PROAIR HFA 108 (90 BASE) MCG/ACT inhaler Inhale 1 puff into the lungs every 6 (six) hours as needed for wheezing or shortness of breath.  07/22/14   Historical Provider, MD   BP 112/54 mmHg  Pulse 64  Temp(Src) 98.8 F (37.1 C) (Oral)  Resp 16  Ht  (1.626 m)  Wt  190 lb (86.183 kg)  BMI 32.60 kg/m2  SpO2 100%  LMP 11/28/2014 Physical Exam  Constitutional: She is oriented to person, place, and time. She appears well-developed and well-nourished. No distress.  HENT:  Head: Normocephalic and atraumatic.  Mouth/Throat: Oropharynx is clear and moist. No oropharyngeal exudate.  Eyes: Conjunctivae and EOM are normal. Pupils are equal, round, and reactive to light.  Neck: Normal range of motion.  Cardiovascular: Normal rate, regular rhythm, normal heart sounds and intact distal pulses.  Exam reveals no gallop and no friction rub.   No murmur heard. Pulmonary/Chest: Effort normal and breath sounds normal. No respiratory distress. She has no wheezes. She has no rales.  Abdominal: Soft. She exhibits no distension. There is no tenderness. There is no guarding.  Musculoskeletal: She exhibits no edema or tenderness.  Neurological: She is alert and oriented to person, place, and time. She has normal strength. She displays tremor (chronic). No cranial nerve deficit or sensory deficit (reports altered sensation (lighter on midface) and sensation that I am pushing hard on right arm with exam). She displays a negative Romberg sign. Coordination and gait normal.  Skin: Skin is warm and dry. No rash noted. She is not diaphoretic. No erythema.  Nursing note and vitals reviewed.   ED Course  Procedures (including critical care time) Labs Review Labs Reviewed  URINALYSIS, ROUTINE W REFLEX MICROSCOPIC (NOT AT [facility]) - Abnormal; Notable for the following:    Leukocytes, UA SMALL (*)    All other components within normal limits  URINE MICROSCOPIC-ADD ON - Abnormal; Notable for the following:    Squamous Epithelial / LPF MANY (*)    Bacteria, UA MANY (*)    All other components within normal limits  PREGNANCY, URINE  POC URINE PREG, ED    Imaging Review No results found. I have personally reviewed and evaluated these images and lab results as part of my medical  decision-making.   EKG Interpretation   Date/Time:  Wednesday January 01 2015 16:44:22 EDT Ventricular Rate:  69 PR Interval:  153 QRS Duration: 86 QT Interval:  394 QTC Calculation: 422 R Axis:   114 Text Interpretation:  Sinus rhythm Right axis deviation Low voltage,  precordial leads TW flattening anterior leads, TW inversion lead III,  nonspecific and new from prior Confirmed by [facility] MD, Taggert Bozzi ([id number]) on  01/01/2015 6:29:54 PM      MDM   Final diagnoses:  Lightheadedness, secondary to dehydration/ambulating in heat   24 year old female with a history of fibromyalgia, essential tremor on multiple medications, presents with concern for episode of nausea and lightheadedness after walking around in the heat at [employer]	[place] [employer].  Patient reports she's had supple episodes of the same in the past. DDx for syncope includes cardiac arrhythmia, MI, PE, electrolyte abnormality, hypovolemia including dehydration and anemia/GI bleed, infection.  Patient without history to suggest anemia, GI bleed, infection. No chest pain to suggest MI or PE.   EKG evaluated by me and shows sinus rhythm with no sign of prolonged QTc, no brugada, no sign of HOCM, no specific ST abnormalities. Upreg negative.  Urinalysis appears contaminated with many epithelial cells and many bacteria, however no significant leukocytes and have low suspicion for UTI. Neurologic exam is normal with exception exception of a small area of altered sensation on the face.  Patient without history of trauma to suggest acute bleed, no hx of sudden HA to suggest SAH, no hx of cardiac defect or early stroke or stroke risk factors and doubt CVA.  Had MR done in 2015 for tremors and has history of migraines.  Discussed reasons to return to ED in detail.  Patient reports she is not had much to eat or drink today, and given clinical scenario of patient walking in the heat prior to development of lightheadedness and nausea, feel symptoms are  likely secondary to dehydration.  Recommended hydration, PCP follow up and return to the ED if she develops other concerning symptoms. Patient discharged in stable condition with understanding of reasons to return.     Alvira Monday, MD 01/01/15 1610  Alvira Monday, MD 01/01/15 1859

## 2015-01-01 NOTE — ED Notes (Signed)
Brought in by EMS. Pt was at [place] at [employer] [place] and started "feeling bad". Stating couldn't think straight and was nauseated" Face red at time. EMS called . No loss of conscoiusness. No seizure activity

## 2015-01-01 NOTE — Discharge Instructions (Signed)
Dizziness °Dizziness is a common problem. It makes you feel unsteady or lightheaded. You may feel like you are about to pass out (faint). Dizziness can lead to injury if you stumble or fall. Anyone can get dizzy, but dizziness is more common in older adults. This condition can be caused by a number of things, including: °· Medicines. °· Dehydration. °· Illness. °HOME CARE °Following these instructions may help with your condition: °Eating and Drinking °· Drink enough fluid to keep your pee (urine) clear or pale yellow. This helps to keep you from getting dehydrated. Try to drink more clear fluids, such as water. °· Do not drink alcohol. °· Limit how much caffeine you drink or eat if told by your doctor. °· Limit how much salt you drink or eat if told by your doctor. °Activity °· Avoid making quick movements. °¨ When you stand up from sitting in a chair, steady yourself until you feel okay. °¨ In the morning, first sit up on the side of the bed. When you feel okay, stand slowly while you hold onto something. Do this until you know that your balance is fine. °· Move your legs often if you need to stand in one place for a long time. Tighten and relax your muscles in your legs while you are standing. °· Do not drive or use heavy machinery if you feel dizzy. °· Avoid bending down if you feel dizzy. Place items in your home so that they are easy for you to reach without leaning over. °Lifestyle °· Do not use any tobacco products, including cigarettes, chewing tobacco, or electronic cigarettes. If you need help quitting, ask your doctor. °· Try to lower your stress level, such as with yoga or meditation. Talk with your doctor if you need help. °General Instructions °· Watch your dizziness for any changes. °· Take medicines only as told by your doctor. Talk with your doctor if you think that your dizziness is caused by a medicine that you are taking. °· Tell a friend or a family member that you are feeling dizzy. If he or  she notices any changes in your behavior, have this person call your doctor. °· Keep all follow-up visits as told by your doctor. This is important. °GET HELP IF: °· Your dizziness does not go away. °· Your dizziness or light-headedness gets worse. °· You feel sick to your stomach (nauseous). °· You have trouble hearing. °· You have new symptoms. °· You are unsteady on your feet or you feel like the room is spinning. °GET HELP RIGHT AWAY IF: °· You throw up (vomit) or have diarrhea and are unable to eat or drink anything. °· You have trouble: °¨ Talking. °¨ Walking. °¨ Swallowing. °¨ Using your arms, hands, or legs. °· You feel generally weak. °· You are not thinking clearly or you have trouble forming sentences. It may take a friend or family member to notice this. °· You have: °¨ Chest pain. °¨ Pain in your belly (abdomen). °¨ Shortness of breath. °¨ Sweating. °· Your vision changes. °· You are bleeding. °· You have a headache. °· You have neck pain or a stiff neck. °· You have a fever. °  °This information is not intended to replace advice given to you by your health care provider. Make sure you discuss any questions you have with your health care provider. °  °Document Released: 02/18/2011 Document Revised: 07/16/2014 Document Reviewed: 02/25/2014 °Elsevier Interactive Patient Education ©2016 [employer] ° °

## 2015-01-07 ENCOUNTER — Ambulatory Visit (INDEPENDENT_AMBULATORY_CARE_PROVIDER_SITE_OTHER): Payer: BLUE CROSS/BLUE SHIELD | Admitting: Neurology

## 2015-01-07 ENCOUNTER — Encounter: Payer: Self-pay | Admitting: Neurology

## 2015-01-07 VITALS — BP 112/73 | HR 88 | Ht 65.0 in | Wt 186.0 lb

## 2015-01-07 DIAGNOSIS — F958 Other tic disorders: Secondary | ICD-10-CM

## 2015-01-07 DIAGNOSIS — G2119 Other drug induced secondary parkinsonism: Secondary | ICD-10-CM

## 2015-01-07 DIAGNOSIS — F959 Tic disorder, unspecified: Secondary | ICD-10-CM

## 2015-01-07 MED ORDER — CLONAZEPAM 0.25 MG PO TBDP: 0.2500 mg | ORAL_TABLET | Freq: Two times a day (BID) | ORAL | Status: DC

## 2015-01-07 NOTE — Progress Notes (Signed)
Reason for visit: Facial movements  Jennifer [name] is an 24 y.o. 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 The patient has been on Cogentin with good improvement in her symptoms in this regard. Within the last 2 weeks, she has developed episodes of eyebrow raising that have been fairly persistent since that time. There are no other associated movements of the head or neck or arms. The tremor issue has improved. The patient has not had a significant change in balance, she has some tendency to lean to the right with sitting. The patient has a sensation of drooling on the right side of face, but the parents have never seen this. The patient comes to this office for further evaluation.  Past Medical History  Diagnosis Date  . Asthma   . Migraines     no aura  . Occasional tremors   . Environmental allergies     seasonal  . Fibromyalgia   . Memory loss   . Disturbance of skin sensation 12/04/2013  . 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 No pertinent past surgical history.  Family History  Problem Relation Age of Onset  . Breast cancer Mother     BRAC negative  age 22  . Diabetes Father   . Hypertension Father   . Thyroid disease Maternal Aunt   . Breast cancer Maternal Aunt 45    BRAC negative  . Thyroid disease Maternal Grandmother   . Alzheimer's disease Maternal Grandfather   . Lung cancer Paternal Grandmother   . Heart attack Paternal Grandfather   . Tremor Neg Hx     Social history:  reports that she has never smoked. She has never used smokeless tobacco. She reports that she does not drink alcohol or use illicit drugs.    Allergies  Allergen Reactions  . Ambien [Zolpidem Tartrate]     hallucinations  . Topamax [Topiramate]     Cognitive side effects    Medications:  Prior to Admission medications   Medication Sig  Start Date End Date Taking? Authorizing Provider  ARIPiprazole (ABILIFY) 5 MG tablet Take 5 mg by mouth daily.   Yes Historical Provider, MD  benztropine (COGENTIN) 0.5 MG tablet Take 1 tablet (0.5 mg total) by mouth 2 (two) times daily. 09/23/14  Yes York Spanielharles K Aisha Greenberger, MD  desogestrel-ethinyl estradiol (VELIVET) 0.1/0.125/0.15 -0.025 MG tablet TAKE 1 TABLET DAILY 09/18/14  Yes Verner Choleborah S Leonard, CNM  fexofenadine (ALLEGRA) 180 MG tablet Take 180 mg by mouth as needed.    Yes Historical Provider, MD  FLUoxetine (PROZAC) 40 MG capsule Take 40 mg by mouth daily.   Yes Historical Provider, MD  LYRICA 100 MG capsule One by mouth 3 times a day Patient taking differently: Take 100 mg by mouth 3 (three) times daily.  01/09/13  Yes Deetta PerlaWilliam H Hickling, MD  PROAIR HFA 108 (90 BASE) MCG/ACT inhaler Inhale 1 puff into the lungs every 6 (six) hours as needed for wheezing or shortness of breath.  07/22/14  Yes Historical Provider, MD  propranolol (INDERAL) 20 MG tablet Take 20 mg by mouth daily.    Yes Historical Provider, MD  propranolol (INDERAL) 40 MG tablet Take 40 mg by mouth at bedtime. 12/22/14  Yes Historical Provider, MD    ROS:  Out of a complete 14 system review of symptoms, the patient complains only of the following  symptoms, and all other reviewed systems are negative.  Drooling Chest pain Memory loss, dizziness, headache, tremors Neck pain Daytime sleepiness  Blood pressure 112/73, pulse 88, height  (1.651 m), weight 186 lb (84.369 kg), last menstrual period 11/28/2014.  Physical Exam  General: The patient is alert and cooperative at the time of the examination.  Skin: No significant peripheral edema is noted.   Neurologic Exam  Mental status: The patient is alert and oriented x 3 at the time of the examination. The patient has apparent normal recent and remote memory, with an apparently normal attention span and concentration ability.   Cranial nerves: Facial symmetry is present.  Speech is normal, no aphasia or dysarthria is noted. Extraocular movements are full. Visual fields are full. The patient has intermittent eyebrow raising movements. The patient splits the midline with vibration sensation, decreased on the right.  Motor: The patient has good strength in all 4 extremities.  Sensory examination: Soft touch sensation is symmetric on the face, but is decreased on the right arm and right leg. Vibration sensation is decreased on the right arm and right leg.  Coordination: The patient has good finger-nose-finger and heel-to-shin bilaterally.  Gait and station: The patient has a normal gait. Tandem gait is minimally unsteady. Romberg is negative. No drift is seen.  Reflexes: Deep tendon reflexes are symmetric, but the reflexes in the lower extremity are somewhat brisk..   Assessment/Plan:  1. History psychosis  2. History of secondary parkinsonism  3. Onset of eyebrow raising movements, etiology unclear  The patient appears to have some features of a nonorganic examination with decreased vibration sensation on the right forehead. The patient has episodes of eyebrow raising that could be voluntary in nature, this does not appear to be completely typical for a tardive movement disorder or for a motor tic disorder. The patient will placed on low-dose clonazepam over the next several weeks as a trial, if this is effective, it may be continued. The patient otherwise will follow-up for her usual revisit in January 2017.  Marlan Palau MD 01/07/2015 7:27 PM  [facility] [address] [place], [place] [address]  Phone [phone] Fax [phone]

## 2015-01-07 NOTE — Patient Instructions (Signed)
    We will start clonazepam 0.25 mg twice a day, call if you need dose adjustments.

## 2015-01-09 NOTE — Telephone Encounter (Signed)
Error

## 2015-01-22 ENCOUNTER — Other Ambulatory Visit: Payer: Self-pay | Admitting: Neurology

## 2015-03-01 ENCOUNTER — Other Ambulatory Visit (HOSPITAL_COMMUNITY): Payer: Self-pay | Admitting: Internal Medicine

## 2015-03-01 ENCOUNTER — Ambulatory Visit (HOSPITAL_COMMUNITY)
Admission: RE | Admit: 2015-03-01 | Discharge: 2015-03-01 | Disposition: A | Discharge status: Home / Self Care | Payer: BLUE CROSS/BLUE SHIELD | Source: Ambulatory Visit | Attending: Internal Medicine | Admitting: Internal Medicine

## 2015-03-01 DIAGNOSIS — M79642 Pain in left hand: Secondary | ICD-10-CM | POA: Insufficient documentation

## 2015-03-01 DIAGNOSIS — W19XXXA Unspecified fall, initial encounter: Secondary | ICD-10-CM

## 2015-03-01 DIAGNOSIS — M25532 Pain in left wrist: Secondary | ICD-10-CM

## 2015-03-25 ENCOUNTER — Ambulatory Visit: Payer: BLUE CROSS/BLUE SHIELD | Admitting: Adult Health

## 2015-03-30 ENCOUNTER — Other Ambulatory Visit: Payer: Self-pay | Admitting: Neurology

## 2015-04-01 NOTE — Progress Notes (Signed)
Medication prescription Clonazepam fax to [facility] per fax number given.

## 2015-04-08 ENCOUNTER — Encounter: Payer: Self-pay | Admitting: Adult Health

## 2015-04-08 ENCOUNTER — Ambulatory Visit (INDEPENDENT_AMBULATORY_CARE_PROVIDER_SITE_OTHER): Payer: BLUE CROSS/BLUE SHIELD | Admitting: Adult Health

## 2015-04-08 VITALS — BP 92/62 | HR 74 | Ht 65.0 in | Wt 183.5 lb

## 2015-04-08 DIAGNOSIS — L299 Pruritus, unspecified: Secondary | ICD-10-CM | POA: Diagnosis not present

## 2015-04-08 DIAGNOSIS — G219 Secondary parkinsonism, unspecified: Secondary | ICD-10-CM | POA: Diagnosis not present

## 2015-04-08 NOTE — Progress Notes (Signed)
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 She returns today for follow-up. At the last visit the patient reported persistent eyebrow raising. She was put on Klonopin. She feels that that was beneficial for the eyebrow raising. The patient mainly comes in today because on Thursday she developed hives that was located on the left thigh followed by itching. The patient was given Benadryl by her parents. They have resolved however the itching has persisted. The patient has continued to take Benadryl in order to get relief from the itching. She followed up with her primary care who thought it may be a side effect of the Cogentin. The patient however has been taking Cogentin since July without any side effects. The patient's dad states that he actually visualize the hives. She denies any other side effects. She reports that the tremor has significantly improved since she started Cogentin. She returns today for an evaluation.   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 The patient has been on Cogentin with good improvement in her symptoms in this regard. Within the last 2 weeks, she has developed episodes of eyebrow raising that have been fairly persistent since that time. There are no other associated movements of the head or neck or arms. The tremor issue has improved. The patient has not had a significant change in balance, she has some tendency to lean to the right with sitting. The patient has a sensation of drooling on the right side of face, but the parents have never seen this. The patient comes to this office for further evaluation.  REVIEW OF SYSTEMS: Out of a complete 14 system  review of symptoms, the patient complains only of the following symptoms, and all other reviewed systems are negative.  Rash, itching  ALLERGIES: Allergies  Allergen Reactions  . Ambien [Zolpidem Tartrate]     hallucinations  . Topamax [Topiramate]     Cognitive side effects    HOME MEDICATIONS: Outpatient Prescriptions Prior to Visit  Medication Sig Dispense Refill  . benztropine (COGENTIN) 0.5 MG tablet TAKE ONE TABLET BY MOUTH TWICE A DAY 60 tablet 3  . clonazePAM (KLONOPIN) 0.25 MG disintegrating tablet DISSOLVE ONE TABLET BY MOUTH TWICE DAILY 60 tablet 1  . desogestrel-ethinyl estradiol (VELIVET) 0.1/0.125/0.15 -0.025 MG tablet TAKE 1 TABLET DAILY 84 tablet 4  . fexofenadine (ALLEGRA) 180 MG tablet Take 180 mg by mouth as needed.     Marland Kitchen FLUoxetine (PROZAC) 40 MG capsule Take 40 mg by mouth daily.    Marland Kitchen LYRICA 100 MG capsule One by mouth 3 times a day (Patient taking differently: Take 100 mg by mouth 3 (three) times daily. ) 93 capsule 5  . PROAIR HFA 108 (90 BASE) MCG/ACT inhaler Inhale 1 puff into the lungs every 6 (six) hours as needed for wheezing or shortness of breath.     . propranolol (INDERAL) 20 MG tablet Take 20 mg by mouth daily.     . propranolol (INDERAL) 40 MG tablet Take 40 mg by mouth at bedtime.    . ARIPiprazole (ABILIFY) 5 MG tablet Take 5 mg by mouth daily.     No facility-administered medications prior to visit.    PAST MEDICAL HISTORY: Past Medical History  Diagnosis  Date  . Asthma   . Migraines     no aura  . Occasional tremors   . Environmental allergies     seasonal  . Fibromyalgia   . Memory loss   . Disturbance of skin sensation 12/04/2013  . 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 No pertinent past surgical history.  FAMILY HISTORY: Family History  Problem Relation Age of Onset  . Breast cancer Mother     BRAC negative  age 75  . Diabetes Father   . Hypertension Father   . Thyroid disease  Maternal Aunt   . Breast cancer Maternal Aunt 45    BRAC negative  . Thyroid disease Maternal Grandmother   . Alzheimer's disease Maternal Grandfather   . Lung cancer Paternal Grandmother   . Heart attack Paternal Grandfather   . Tremor Neg Hx     SOCIAL HISTORY: Social History   Social History  . Marital Status: Single    Spouse Name: N/A  . Number of Children: 0  . Years of Education: college/st   Occupational History  . STUDENT    Social History Main Topics  . Smoking status: Never Smoker   . Smokeless tobacco: Never Used  . Alcohol Use: No  . Drug Use: No  . Sexual Activity: No     Comment: never sexually active   Other Topics Concern  . Not on file   Social History Narrative   Patient is right handed.   Patient does not drink caffeine.   Patient lives at home with family.      PHYSICAL EXAM  Filed Vitals:   04/08/15 1516  BP: 92/62  Pulse: 74  Height:  (1.651 m)  Weight: 183 lb 8 oz (83.235 kg)   Body mass index is 30.54 kg/(m^2).  Generalized: Well developed, in no acute distress   Neurological examination  Mentation: Alert oriented to time, place, history taking. Follows all commands speech and language fluent Cranial nerve II-XII: Pupils were equal round reactive to light. Extraocular movements were full, visual field were full on confrontational test. Facial sensation and strength were normal. Uvula tongue midline. Head turning and shoulder shrug  were normal and symmetric. Motor: The motor testing reveals 5 over 5 strength of all 4 extremities. Good symmetric motor tone is noted throughout.  Sensory: Sensory testing is intact to soft touch on all 4 extremities. No evidence of extinction is noted.  Coordination: Cerebellar testing reveals good finger-nose-finger and heel-to-shin bilaterally.  Gait and station: Gait is normal. Tandem gait is normal. Romberg is negative. No drift is seen.  Reflexes: Deep tendon reflexes are symmetric and normal  bilaterally.   DIAGNOSTIC DATA (LABS, IMAGING, TESTING) - I reviewed patient records, labs, notes, testing and imaging myself where available.        ASSESSMENT AND PLAN 25 y.o. year old female  has a past medical history of Asthma; Migraines; Occasional tremors; Environmental allergies; Fibromyalgia; Memory loss; Disturbance of skin sensation (12/04/2013); and Secondary parkinsonism (HCC) (09/23/2014). here with:  1. Secondary parkinsonism 2. Pruritus  The patient and her dad believe that her itching to be contributed to the Cogentin. I have advised that the only to confirm this is for her to be taken off the Cogentin. She will decrease the Cogentin to 1 tablet daily for 1 week then stop the medication. She should remain off medication for 2-3 weeks. If the itching does not resolve can resume Cogentin and should follow up  with her primary care in regards to the itching. Patient and her dad is amenable to this plan. She will follow-up in 3-4 months or sooner if needed.   Butch Penny, MSN, NP-C 04/08/2015, 3:30 PM [facility] [address], [address] [place], [place] [address] [phone]

## 2015-04-08 NOTE — Patient Instructions (Signed)
Decrease 1 tablet daily for 1 week then stop. Give it 2-3 weeks if itching does not resolved you may restart cogentin and then follow-up with PCP for itching If your symptoms worsen or you develop new symptoms please let us know.

## 2015-04-08 NOTE — Progress Notes (Signed)
I have read the note, and I agree with the clinical assessment and plan.  Estell Puccini KEITH   

## 2015-04-28 ENCOUNTER — Telehealth (HOSPITAL_COMMUNITY): Payer: Self-pay | Admitting: *Deleted

## 2015-04-30 ENCOUNTER — Telehealth (HOSPITAL_COMMUNITY): Payer: Self-pay | Admitting: *Deleted

## 2015-04-30 NOTE — Telephone Encounter (Signed)
Pt mother called stating she is returning Ruby's call to schedule her child a new pt appt. Did not realized pt was above the age of 25yrs old and was going over demographic information with pt. When realized the age, asked pt mother for pt D.O.B again and she verified the information. Then asked pt mom what was pt age and she verified. Informed pt mother that due to HIPAA and pt being over the age of 49yrs and we would need to talk to her to sch her appt. Per pt mother, pt have memory problems and won't be able to answer the questions I'm asking her that's why she is try to sch the appt for her. Asked pt mother if pt is competent enough to communicate with office and she stated yes she is but she won't be able to answer a lot of questions that is asked. Then mom said it's going to be a while before pt can call office back because she's at work and she have pt insurance information with her. Then ref. doctor office (Dr. Margo Aye) called wanting to make appt for pt. Spoke with Revonda Standard. Informed her that pt mother did call them and office told her to have pt call to make appt due to her being above 18 yrs. Revonda Standard did show understanding and wanted to make appt for pt.

## 2015-06-10 ENCOUNTER — Ambulatory Visit (HOSPITAL_COMMUNITY): Payer: Self-pay | Admitting: Psychiatry

## 2015-07-08 ENCOUNTER — Encounter: Payer: Self-pay | Admitting: Adult Health

## 2015-07-08 ENCOUNTER — Ambulatory Visit (INDEPENDENT_AMBULATORY_CARE_PROVIDER_SITE_OTHER): Payer: BLUE CROSS/BLUE SHIELD | Admitting: Adult Health

## 2015-07-08 VITALS — BP 112/70 | HR 86 | Ht 65.0 in | Wt 185.2 lb

## 2015-07-08 DIAGNOSIS — G250 Essential tremor: Secondary | ICD-10-CM | POA: Diagnosis not present

## 2015-07-08 DIAGNOSIS — Z8659 Personal history of other mental and behavioral disorders: Secondary | ICD-10-CM

## 2015-07-08 DIAGNOSIS — G219 Secondary parkinsonism, unspecified: Secondary | ICD-10-CM

## 2015-07-08 NOTE — Progress Notes (Signed)
PATIENT: Jennifer PaganiniHannah L [name] DOB: 07/08/1990  REASON FOR VISIT: follow up- psychosis, secondary parkinsonism, essential tremor HISTORY FROM: patient  HISTORY OF PRESENT ILLNESS: 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 She returns today for follow-up. In the past she is reported to types of tremor. One is an action tremor and the other is a resting tremor. It was thought that perhaps Abilify was exacerbating her tremor. She was placed on Cogentin. At the last visit she had developed a rash that she thought was related to Cogentin. Her dad states that they weaned the patient off of Cogentin and her rash pretty much resolved. They tried to restart the Cogentin and her rash returned. She is on Cogentin half a tablet twice a day and is tolerating that well. She does report a dry mouth. She reports that her tremor is worse off of Cogentin. She remains on Abilify as well. She has a follow-up appointment with her psychiatrist in 2 weeks to discuss her medications. The patient also reports that she is off Klonopin and has not noticed any changes since she discontinued this medication. She returns today for an evaluation.  HISTORY 04/08/15 (MM): 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 She returns today for follow-up. At the last visit the patient reported persistent eyebrow raising. She was put on Klonopin. She feels that that was beneficial for the eyebrow raising. The patient mainly comes in today because on Thursday she developed hives that was located on the left thigh followed by itching. The patient was given Benadryl by her parents. They have resolved however the itching has persisted. The patient has continued to take Benadryl in order to get relief from the itching. She followed up with her primary care who thought it may be a side effect of the Cogentin. The patient however  has been taking Cogentin since July without any side effects. The patient's dad states that he actually visualize the hives. She denies any other side effects. She reports that the tremor has significantly improved since she started Cogentin. She returns today for an evaluation.   HISTORY 12/18/14: 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 The patient has been on Cogentin with good improvement in her symptoms in this regard. Within the last 2 weeks, she has developed episodes of eyebrow raising that have been fairly persistent since that time. There are no other associated movements of the head or neck or arms. The tremor issue has improved. The patient has not had a significant change in balance, she has some tendency to lean to the right with sitting. The patient has a sensation of drooling on the right side of face, but the parents have never seen this. The patient comes to this office for further evaluation.   REVIEW OF SYSTEMS: Out of a complete 14 system review of symptoms, the patient complains only of the following symptoms, and all other reviewed systems are negative.  Eye itching, double vision, fatigue, ringing in ears, trouble swallowing, frequent waking, sleep talking, joint pain, back pain, rash, itching, confusion, depression, nervous/anxious, hallucinations, numbness, headache, dizziness, memory loss, tremors  ALLERGIES: Allergies  Allergen Reactions  . Ambien [Zolpidem Tartrate]     hallucinations  . Cogentin [Benztropine]     Rash, itching, dry mouth  . Topamax [Topiramate]  Cognitive side effects    HOME MEDICATIONS: Outpatient Prescriptions Prior to Visit  Medication Sig Dispense Refill  . ARIPiprazole (ABILIFY) 10 MG tablet Take 0.5 tablets by mouth daily.     . benztropine (COGENTIN) 0.5 MG tablet TAKE ONE TABLET BY MOUTH TWICE A DAY (Patient taking differently: TAKE 1/2 TABLET BY  MOUTH TWICE A DAY) 60 tablet 3  . clonazePAM (KLONOPIN) 0.25 MG disintegrating tablet DISSOLVE ONE TABLET BY MOUTH TWICE DAILY (Patient not taking: Reported on 07/08/2015) 60 tablet 1  . desogestrel-ethinyl estradiol (VELIVET) 0.1/0.125/0.15 -0.025 MG tablet TAKE 1 TABLET DAILY 84 tablet 4  . fexofenadine (ALLEGRA) 180 MG tablet Take 180 mg by mouth as needed.     Marland Kitchen FLUoxetine (PROZAC) 40 MG capsule Take 40 mg by mouth daily.    Marland Kitchen LYRICA 100 MG capsule One by mouth 3 times a day (Patient taking differently: Take 100 mg by mouth 3 (three) times daily. ) 93 capsule 5  . PROAIR HFA 108 (90 BASE) MCG/ACT inhaler Inhale 1 puff into the lungs every 6 (six) hours as needed for wheezing or shortness of breath.     . propranolol (INDERAL) 20 MG tablet Take 20 mg by mouth daily.     . propranolol (INDERAL) 40 MG tablet Take 40 mg by mouth at bedtime.     No facility-administered medications prior to visit.    PAST MEDICAL HISTORY: Past Medical History  Diagnosis Date  . Asthma   . Migraines     no aura  . Occasional tremors   . Environmental allergies     seasonal  . Fibromyalgia   . Memory loss   . Disturbance of skin sensation 12/04/2013  . 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 No pertinent past surgical history.  FAMILY HISTORY: Family History  Problem Relation Age of Onset  . Breast cancer Mother     BRAC negative  age 43  . Diabetes Father   . Hypertension Father   . Thyroid disease Maternal Aunt   . Breast cancer Maternal Aunt 45    BRAC negative  . Thyroid disease Maternal Grandmother   . Alzheimer's disease Maternal Grandfather   . Lung cancer Paternal Grandmother   . Heart attack Paternal Grandfather   . Tremor Neg Hx     SOCIAL HISTORY: Social History   Social History  . Marital Status: Single    Spouse Name: N/A  . Number of Children: 0  . Years of Education: college/st   Occupational History  . STUDENT    Social  History Main Topics  . Smoking status: Never Smoker   . Smokeless tobacco: Never Used  . Alcohol Use: No  . Drug Use: No  . Sexual Activity: No     Comment: never sexually active   Other Topics Concern  . Not on file   Social History Narrative   Patient is right handed.   Patient does not drink caffeine.   Patient lives at home with family.      PHYSICAL EXAM  Filed Vitals:   07/08/15 1457  BP: 112/70  Pulse: 86  Height:  (1.651 m)  Weight: 185 lb 3.2 oz (84.006 kg)   Body mass index is 30.82 kg/(m^2).  Generalized: Well developed, in no acute distress   Neurological examination  Mentation: Alert oriented to time, place, history taking. Follows all commands speech and language fluent. Flat affect Cranial nerve II-XII: Pupils were equal round reactive to light.  Extraocular movements were full, visual field were full on confrontational test. Facial sensation and strength were normal. Uvula tongue midline. Head turning and shoulder shrug  were normal and symmetric. Motor: The motor testing reveals 5 over 5 strength of all 4 extremities. Good symmetric motor tone is noted throughout. Intention tremor noted in hands, very mild, left<right. Sensory: Sensory testing is intact to soft touch on all 4 extremities. No evidence of extinction is noted.  Coordination: Cerebellar testing reveals good finger-nose-finger and heel-to-shin bilaterally.  Gait and station: Gait is normal. Tandem gait is Unsteady. Romberg is negative. No drift is seen.  Reflexes: Deep tendon reflexes are symmetric and normal bilaterally.   DIAGNOSTIC DATA (LABS, IMAGING, TESTING) - I reviewed patient records, labs, notes, testing and imaging myself where available.     ASSESSMENT AND PLAN 25 y.o. year old female  has a past medical history of Asthma; Migraines; Occasional tremors; Environmental allergies; Fibromyalgia; Memory loss; Disturbance of skin sensation (12/04/2013); and Secondary parkinsonism  (HCC) (09/23/2014). here with:  1. Secondary parkinsonism 2. Essential tremor 3. History of psychosis  For now the patient will remain on Cogentin 0.5 mg half a tablet twice a day. She will follow-up with her psychiatrist in 2 weeks. Pending any medication adjustment for her psychiatrist we may discontinue Cogentin and try a different medication for her tremor. In the past the patient has tried propranolol and Topamax. She will have her notes from her psychiatrist faxed to our office. She will follow-up in 6 months with Dr. Anne Hahn.     Butch Penny, MSN, NP-C 07/08/2015, 3:55 PM [facility] [address], [address] [place], [place] [address] [phone]

## 2015-07-08 NOTE — Patient Instructions (Signed)
Discuss treatment with psychiatrist Please fax office notes  If your symptoms worsen or you develop new symptoms please let us know.

## 2015-07-09 ENCOUNTER — Emergency Department (HOSPITAL_COMMUNITY)
Admission: EM | Admit: 2015-07-09 | Discharge: 2015-07-10 | Disposition: A | Discharge status: Other Acute Inpatient Hospital | Payer: BLUE CROSS/BLUE SHIELD | Attending: Emergency Medicine | Admitting: Emergency Medicine

## 2015-07-09 ENCOUNTER — Encounter (HOSPITAL_COMMUNITY): Payer: Self-pay | Admitting: Emergency Medicine

## 2015-07-09 DIAGNOSIS — G43909 Migraine, unspecified, not intractable, without status migrainosus: Secondary | ICD-10-CM | POA: Insufficient documentation

## 2015-07-09 DIAGNOSIS — M797 Fibromyalgia: Secondary | ICD-10-CM | POA: Diagnosis not present

## 2015-07-09 DIAGNOSIS — G219 Secondary parkinsonism, unspecified: Secondary | ICD-10-CM | POA: Diagnosis not present

## 2015-07-09 DIAGNOSIS — F29 Unspecified psychosis not due to a substance or known physiological condition: Secondary | ICD-10-CM | POA: Diagnosis not present

## 2015-07-09 DIAGNOSIS — F419 Anxiety disorder, unspecified: Secondary | ICD-10-CM | POA: Insufficient documentation

## 2015-07-09 DIAGNOSIS — Z79899 Other long term (current) drug therapy: Secondary | ICD-10-CM | POA: Diagnosis not present

## 2015-07-09 DIAGNOSIS — J45909 Unspecified asthma, uncomplicated: Secondary | ICD-10-CM | POA: Diagnosis not present

## 2015-07-09 DIAGNOSIS — F22 Delusional disorders: Secondary | ICD-10-CM | POA: Diagnosis present

## 2015-07-09 DIAGNOSIS — Z008 Encounter for other general examination: Secondary | ICD-10-CM | POA: Diagnosis present

## 2015-07-09 DIAGNOSIS — Z793 Long term (current) use of hormonal contraceptives: Secondary | ICD-10-CM | POA: Insufficient documentation

## 2015-07-09 LAB — COMPREHENSIVE METABOLIC PANEL
ALT: 19 U/L (ref 14–54)
AST: 19 U/L (ref 15–41)
Albumin: 3.9 g/dL (ref 3.5–5.0)
Alkaline Phosphatase: 60 U/L (ref 38–126)
Anion gap: 10 (ref 5–15)
BUN: 16 mg/dL (ref 6–20)
CHLORIDE: 107 mmol/L (ref 101–111)
CO2: 21 mmol/L — ABNORMAL LOW (ref 22–32)
Calcium: 9.4 mg/dL (ref 8.9–10.3)
Creatinine, Ser: 0.58 mg/dL (ref 0.44–1.00)
Glucose, Bld: 110 mg/dL — ABNORMAL HIGH (ref 65–99)
POTASSIUM: 3.9 mmol/L (ref 3.5–5.1)
Sodium: 138 mmol/L (ref 135–145)
Total Bilirubin: 0.2 mg/dL — ABNORMAL LOW (ref 0.3–1.2)
Total Protein: 8.1 g/dL (ref 6.5–8.1)

## 2015-07-09 LAB — RAPID URINE DRUG SCREEN, HOSP PERFORMED
AMPHETAMINES: NOT DETECTED
BENZODIAZEPINES: NOT DETECTED
Barbiturates: NOT DETECTED
COCAINE: NOT DETECTED
OPIATES: NOT DETECTED
Tetrahydrocannabinol: NOT DETECTED

## 2015-07-09 LAB — CBC
HCT: 40.4 % (ref 36.0–46.0)
Hemoglobin: 13.8 g/dL (ref 12.0–15.0)
MCH: 30.3 pg (ref 26.0–34.0)
MCHC: 34.2 g/dL (ref 30.0–36.0)
MCV: 88.6 fL (ref 78.0–100.0)
PLATELETS: 285 10*3/uL (ref 150–400)
RBC: 4.56 MIL/uL (ref 3.87–5.11)
RDW: 12.9 % (ref 11.5–15.5)
WBC: 9.9 10*3/uL (ref 4.0–10.5)

## 2015-07-09 LAB — ETHANOL

## 2015-07-09 LAB — ACETAMINOPHEN LEVEL

## 2015-07-09 LAB — SALICYLATE LEVEL

## 2015-07-09 MED ORDER — PROPRANOLOL HCL 20 MG PO TABS
20.0000 mg | ORAL_TABLET | Freq: Every morning | ORAL | Status: DC
  Administered 2015-07-10: 20 mg via ORAL
  Filled 2015-07-09: qty 1

## 2015-07-09 MED ORDER — ACETAMINOPHEN 325 MG PO TABS: 650.0000 mg | ORAL_TABLET | ORAL | Status: DC | PRN

## 2015-07-09 MED ORDER — IBUPROFEN 200 MG PO TABS: 600.0000 mg | ORAL_TABLET | Freq: Three times a day (TID) | ORAL | Status: DC | PRN

## 2015-07-09 MED ORDER — LORAZEPAM 1 MG PO TABS: 1.0000 mg | ORAL_TABLET | Freq: Three times a day (TID) | ORAL | Status: DC | PRN

## 2015-07-09 MED ORDER — PREGABALIN 50 MG PO CAPS
100.0000 mg | ORAL_CAPSULE | Freq: Three times a day (TID) | ORAL | Status: DC
  Administered 2015-07-10: 100 mg via ORAL
  Filled 2015-07-09: qty 2

## 2015-07-09 MED ORDER — FLUOXETINE HCL 20 MG PO CAPS
40.0000 mg | ORAL_CAPSULE | Freq: Every day | ORAL | Status: DC
  Administered 2015-07-10: 40 mg via ORAL
  Filled 2015-07-09: qty 2

## 2015-07-09 MED ORDER — PROPRANOLOL HCL 40 MG PO TABS
40.0000 mg | ORAL_TABLET | Freq: Every day | ORAL | Status: DC
  Filled 2015-07-09 (×2): qty 1

## 2015-07-09 MED ORDER — ALBUTEROL SULFATE HFA 108 (90 BASE) MCG/ACT IN AERS: 1.0000 | INHALATION_SPRAY | Freq: Four times a day (QID) | RESPIRATORY_TRACT | Status: DC | PRN

## 2015-07-09 MED ORDER — ARIPIPRAZOLE 5 MG PO TABS
5.0000 mg | ORAL_TABLET | Freq: Every day | ORAL | Status: DC
  Administered 2015-07-10: 5 mg via ORAL
  Filled 2015-07-09: qty 1

## 2015-07-09 MED ORDER — BENZTROPINE MESYLATE 1 MG PO TABS: 0.5000 mg | ORAL_TABLET | Freq: Two times a day (BID) | ORAL | Status: DC
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
## 2015-07-09 NOTE — BH Assessment (Signed)
Assessment completed. Consulted Donell SievertSpencer Simon, PA-C who recommended inpatient treatment. TTS to seek placement. Informed Dr. Fredderick PhenixBelfi of the recommendation.
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
## 2015-07-09 NOTE — ED Notes (Signed)
Patient belonging placed in locker #32.  Sweatshirt Tank top [employer] Boots 3 Nintendo DS games Pokemon card bra
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
## 2015-07-09 NOTE — Progress Notes (Signed)
I reviewed note and agree with plan.   VIKRAM R. PENUMALLI, MD 07/09/2015, 5:28 PM Certified in Neurology, Neurophysiology and Neuroimaging  [facility] [address], [address] [place], [place] [address] [phone]  

## 2015-07-10 ENCOUNTER — Encounter (HOSPITAL_COMMUNITY): Payer: Self-pay | Admitting: Psychiatry

## 2015-07-10 ENCOUNTER — Inpatient Hospital Stay (HOSPITAL_COMMUNITY)
Admission: AD | Admit: 2015-07-10 | Discharge: 2015-07-14 | DRG: 885 | Disposition: A | Discharge status: Home / Self Care | Payer: BLUE CROSS/BLUE SHIELD | Source: Intra-hospital | Attending: Psychiatry | Admitting: Psychiatry

## 2015-07-10 DIAGNOSIS — Z915 Personal history of self-harm: Secondary | ICD-10-CM | POA: Diagnosis not present

## 2015-07-10 DIAGNOSIS — F200 Paranoid schizophrenia: Principal | ICD-10-CM | POA: Clinically undetermined

## 2015-07-10 DIAGNOSIS — F909 Attention-deficit hyperactivity disorder, unspecified type: Secondary | ICD-10-CM | POA: Diagnosis present

## 2015-07-10 DIAGNOSIS — G219 Secondary parkinsonism, unspecified: Secondary | ICD-10-CM | POA: Diagnosis not present

## 2015-07-10 DIAGNOSIS — F29 Unspecified psychosis not due to a substance or known physiological condition: Secondary | ICD-10-CM | POA: Diagnosis present

## 2015-07-10 DIAGNOSIS — F22 Delusional disorders: Secondary | ICD-10-CM | POA: Diagnosis present

## 2015-07-10 DIAGNOSIS — G250 Essential tremor: Secondary | ICD-10-CM | POA: Diagnosis present

## 2015-07-10 DIAGNOSIS — Z8659 Personal history of other mental and behavioral disorders: Secondary | ICD-10-CM

## 2015-07-10 MED ORDER — PREGABALIN 100 MG PO CAPS
100.0000 mg | ORAL_CAPSULE | Freq: Three times a day (TID) | ORAL | Status: DC
  Administered 2015-07-10 – 2015-07-14 (×12): 100 mg via ORAL
  Filled 2015-07-10 (×12): qty 1

## 2015-07-10 MED ORDER — PROPRANOLOL HCL 40 MG PO TABS
40.0000 mg | ORAL_TABLET | Freq: Every day | ORAL | Status: DC
  Administered 2015-07-10 – 2015-07-13 (×4): 40 mg via ORAL
  Filled 2015-07-10 (×2): qty 1
  Filled 2015-07-10: qty 4
  Filled 2015-07-10 (×4): qty 1

## 2015-07-10 MED ORDER — ACETAMINOPHEN 325 MG PO TABS
650.0000 mg | ORAL_TABLET | Freq: Four times a day (QID) | ORAL | Status: DC | PRN
  Administered 2015-07-13: 650 mg via ORAL
  Filled 2015-07-10: qty 2

## 2015-07-10 MED ORDER — ARIPIPRAZOLE 5 MG PO TABS
5.0000 mg | ORAL_TABLET | Freq: Every day | ORAL | Status: DC
  Administered 2015-07-11: 5 mg via ORAL
  Filled 2015-07-10 (×2): qty 1

## 2015-07-10 MED ORDER — PROPRANOLOL HCL 20 MG PO TABS
20.0000 mg | ORAL_TABLET | Freq: Every morning | ORAL | Status: DC
  Administered 2015-07-11 – 2015-07-14 (×4): 20 mg via ORAL
  Filled 2015-07-10 (×6): qty 1

## 2015-07-10 MED ORDER — TRIHEXYPHENIDYL HCL 2 MG PO TABS
2.0000 mg | ORAL_TABLET | Freq: Two times a day (BID) | ORAL | Status: DC
  Filled 2015-07-10 (×2): qty 1

## 2015-07-10 MED ORDER — TRIHEXYPHENIDYL HCL 2 MG PO TABS
2.0000 mg | ORAL_TABLET | Freq: Two times a day (BID) | ORAL | Status: DC
  Administered 2015-07-10 – 2015-07-11 (×2): 2 mg via ORAL
  Filled 2015-07-10 (×4): qty 1

## 2015-07-10 MED ORDER — ALBUTEROL SULFATE HFA 108 (90 BASE) MCG/ACT IN AERS: 1.0000 | INHALATION_SPRAY | Freq: Four times a day (QID) | RESPIRATORY_TRACT | Status: DC | PRN

## 2015-07-10 MED ORDER — ALUM & MAG HYDROXIDE-SIMETH 200-200-20 MG/5ML PO SUSP: 30.0000 mL | ORAL | Status: DC | PRN

## 2015-07-10 MED ORDER — FLUOXETINE HCL 20 MG PO CAPS
40.0000 mg | ORAL_CAPSULE | Freq: Every day | ORAL | Status: DC
Start: 2015-07-11 — End: 2015-07-14
  Administered 2015-07-11 – 2015-07-14 (×4): 40 mg via ORAL
  Filled 2015-07-10 (×6): qty 2

## 2015-07-10 MED ORDER — MAGNESIUM HYDROXIDE 400 MG/5ML PO SUSP: 30.0000 mL | Freq: Every day | ORAL | Status: DC | PRN
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
## 2015-07-10 NOTE — ED Notes (Signed)
Patient has been cooperative, but anxious and confused.  She was heard outside of her room yelling to someone to allow her mother to come back to her room.  When I entered the room she was looking up at the ceiling vent.  When asked who she was yelling at or communicating with she denied that she was talking to anyone.  Answers to questions are extremely vague.  She did take her medications this morning when I showed her the packages and opened them in front of her.  She did not eat breakfast stating she was a little bit scared of it (believed it was poisoned).  She came into the hallway to use the phone and kept hollering for the person to pick up.  MHT was able to re-direct her back to her room.

## 2015-07-10 NOTE — Plan of Care (Signed)
Problem: Alteration in thought process Goal: STG-Patient is able to follow short directions Outcome: Progressing Patient was able to follow directions during medication administration and able to respond to questions.
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
## 2015-07-10 NOTE — ED Notes (Signed)
Pt standing at the door of her room.  She is calm and cooperative.  When asked if she is doing ok, she said "no, i'm still not good"  She was asked if she'd like to watch TV and said "no, my dad can see me through it"  When she was reassured that he couldn't she said "he has the frequency to the tv"  Pt was given support and and asked if she'd like to read.  She said she loves to read but can't very well because she is dyslexic and that she can't read well because of her medications.   She was given a magazine and sat down with it for a few minutes then went back to standing at the door.  She asked why no one would let her leave her room and it was explained to her that we try and provide privacy to all our patients by having each patient remain in their rooms.  She stated understanding.
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
## 2015-07-10 NOTE — ED Notes (Signed)
[facility] to [facility].  [facility] [employer] [employer]  All belongings given to the driver.  Patient requested that I tell her mother where she was going, which I did.

## 2015-07-10 NOTE — Progress Notes (Signed)
D- Patient is childlike in a depressed mood with a flat affect.  Patient remained in her room with minimal interaction with others.  Patient does not appear in any distress.  Respirations even and unlabored. Color satisfactory. Patient did not attend Karaoke wrap up group.  Patient currently denies SI, HI, AVH, and pain. No complaints. A- Scheduled medications administered to patient, per MD orders. Support and encouragement provided.  Routine safety checks conducted every 15 minutes.  Patient informed to notify staff with problems or concerns. R- No adverse drug reactions noted. Patient contracts for safety at this time.  Patient remains safe at this time.

## 2015-07-10 NOTE — ED Notes (Signed)
[facility] Expected date:  Expected time:  Means of arrival:  Comments: TCU 32

## 2015-07-10 NOTE — ED Notes (Signed)
Ambulated with patient to room 43.  Pt was calm and cooperative.

## 2015-07-10 NOTE — ED Notes (Signed)
Pt standing in the door with a blank look on her face. Pt asking for a band aid for her hand. No open wound seen on the pt's hand. Pt states she will need a band aid because her hand "will be opening up soon." Pt reassured that a wound was not visible on her hand and if one appeared a dressing will be applied at that time.

## 2015-07-10 NOTE — BH Assessment (Signed)
[facility] The accepting provider is Dr. Jannifer FranklinAkintayo and Julieanne CottonJosephine, NP. Room #501-2. Support paperwork completed. Pending [facility].

## 2015-07-10 NOTE — Progress Notes (Signed)
Patient did not attend karaoke group tonight. 

## 2015-07-10 NOTE — Tx Team (Addendum)
Initial Interdisciplinary Treatment Plan   PATIENT STRESSORS: Difficult to assess due to patient's paranoid delusions   PATIENT STRENGTHS: Communication skills General fund of knowledge Physical Health Supportive family/friends   PROBLEM LIST: Problem List/Patient Goals Date to be addressed Date deferred Reason deferred Estimated date of resolution  "I don't really have any goals. I just need to be somewhere safe." 07/10/15           Psychosis, paranoia 07/10/15     Increased risk for SI                                     DISCHARGE CRITERIA:  Improved stabilization in mood, thinking, and/or behavior Need for constant or close observation no longer present Verbal commitment to aftercare and medication compliance  PRELIMINARY DISCHARGE PLAN: Outpatient therapy Participate in family therapy Return to previous living arrangement  PATIENT/FAMIILY INVOLVEMENT: This treatment plan has been presented to and reviewed with the patient, Jennifer [name], and/or family member.  The patient and family have been given the opportunity to ask questions and make suggestions.  [name], [name] 07/10/2015, 5:45 PM

## 2015-07-11 ENCOUNTER — Encounter (HOSPITAL_COMMUNITY): Payer: Self-pay | Admitting: Psychiatry

## 2015-07-11 DIAGNOSIS — Z8659 Personal history of other mental and behavioral disorders: Secondary | ICD-10-CM

## 2015-07-11 DIAGNOSIS — G250 Essential tremor: Secondary | ICD-10-CM | POA: Diagnosis present

## 2015-07-11 MED ORDER — LORAZEPAM 1 MG PO TABS
1.0000 mg | ORAL_TABLET | Freq: Four times a day (QID) | ORAL | Status: DC | PRN
  Filled 2015-07-11: qty 1

## 2015-07-11 MED ORDER — AMANTADINE HCL 100 MG PO CAPS
100.0000 mg | ORAL_CAPSULE | Freq: Two times a day (BID) | ORAL | Status: DC
  Administered 2015-07-11 – 2015-07-14 (×7): 100 mg via ORAL
  Filled 2015-07-11 (×11): qty 1

## 2015-07-11 MED ORDER — QUETIAPINE FUMARATE 25 MG PO TABS
25.0000 mg | ORAL_TABLET | Freq: Every day | ORAL | Status: DC
  Administered 2015-07-11 – 2015-07-12 (×2): 25 mg via ORAL
  Filled 2015-07-11 (×5): qty 1

## 2015-07-11 MED ORDER — LORAZEPAM 2 MG/ML IJ SOLN: 1.0000 mg | Freq: Four times a day (QID) | INTRAMUSCULAR | Status: DC | PRN
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
## 2015-07-11 NOTE — Progress Notes (Signed)
D-  Patient has been compliant with treatment this shift.  Patient took medications, attended all groups, and interacted appropriately with staff and peers.  Patient had no incidents of behavioral dyscontrol and denies SI, HI and AVH.  A-  Assess patient for safety, offer medications as prescribed, engage patient in 1:1 therapeutic staff talks.  R-  Patient able to contract for safety, continue to monitor for safety.  

## 2015-07-11 NOTE — Progress Notes (Signed)
[facility] Group Notes:  (Nursing/MHT/Case Management/Adjunct)  Date:  07/11/2015  Time:  9:14 PM  Type of Therapy:  Psychoeducational Skills  Participation Level:  Active  Participation Quality:  Resistant  Affect:  Flat  Cognitive:  Lacking  Insight:  Lacking  Engagement in Group:  Resistant  Modes of Intervention:  Education  Summary of Progress/Problems: The patient stated that she had a good day, but felt that for some reason she had a bad day. The patient did not expound any further. As for the theme of the day, her coping skill will be to write down her thoughts.   Hazle CocaGOODMAN, Pascuala Klutts S 07/11/2015, 9:14 PM
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
## 2015-07-11 NOTE — [facility] Suicide Risk Assessment (Signed)
[facility]   Nursing information obtained from:  Patient, Review of record Demographic factors:  Adolescent or young adult, Caucasian, Unemployed Current Mental Status:   (denies all) Loss Factors:   (denies all) Historical Factors:  Family history of mental illness or substance abuse Risk Reduction Factors:  Sense of responsibility to family, Living with another person, especially a relative, Positive social support, Positive therapeutic relationship  Total Time spent with patient: 30 minutes Principal Problem: Paranoid schizophrenia (HCC) Diagnosis:   Patient Active Problem List   Diagnosis Date Noted  . 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 Psychosis [F29] 07/10/2015  . Paranoid schizophrenia (HCC) [F20.0] 07/10/2015  . Motor tic disorder [F95.9] 01/07/2015  . 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 Fibromyalgia [M79.7] 09/23/2014  . Disturbance of skin sensation [R20.9] 12/04/2013  . Body mass index, pediatric, 85th percentile to less than 95th percentile for age [facility][Z68.53] 01/14/2013  . Essential and other specified forms of tremor [G25.0, G25.2] 12/04/2012  . Myalgia and myositis, unspecified [IMO0001] 12/04/2012  . Laxity of ligament [M24.20] 12/04/2012   Subjective Data: Please see H&P.   Continued Clinical Symptoms:  Alcohol Use Disorder Identification Test Final Score (AUDIT): 0 The "Alcohol Use Disorders Identification Test", Guidelines for Use in Primary Care, Second Edition.  World Science writerHealth Organization [facility](WHO). Score between 0-7:  no or low risk or alcohol related problems. Score between 8-15:  moderate risk of alcohol related problems. Score between 16-19:  high risk of alcohol related problems. Score 20 or above:  warrants further diagnostic evaluation for alcohol dependence and treatment.   CLINICAL FACTORS:   Schizophrenia:   Less than 23 years old Paranoid or undifferentiated type Previous Psychiatric Diagnoses and  Treatments Medical Diagnoses and Treatments/Surgeries    Psychiatric Specialty Exam: Review of Systems  Psychiatric/Behavioral: Positive for depression. The patient is nervous/anxious.   All other systems reviewed and are negative.   Blood pressure 101/68, pulse 117, temperature 98.5 F (36.9 C), temperature source Oral, resp. rate 20, height 5\' 4"  (1.626 m), weight 82.101 kg (181 lb), last menstrual period 06/25/2015, SpO2 100 %.Body mass index is 31.05 kg/(m^2).                      Please see H&P.                                     COGNITIVE FEATURES THAT CONTRIBUTE TO RISK:  Closed-mindedness, Polarized thinking and Thought constriction (tunnel vision)    SUICIDE RISK:   Moderate:  Frequent suicidal ideation with limited intensity, and duration, some specificity in terms of plans, no associated intent, good self-control, limited dysphoria/symptomatology, some risk factors present, and identifiable protective factors, including available and accessible social support.  PLAN OF CARE: Please see H&P.   I certify that inpatient services furnished can reasonably be expected to improve the patient's condition.   Kiyani Jernigan, MD 07/11/2015, 9:58 AM

## 2015-07-11 NOTE — [facility] Group Notes (Signed)
[facility] LCSW Group Therapy  07/11/2015  1:05 PM  Type of Therapy:  Group therapy  Participation Level:  Active  Participation Quality:  Attentive  Affect:  Flat  Cognitive:  Oriented  Insight:  Limited  Engagement in Therapy:  Limited  Modes of Intervention:  Discussion, Socialization  Summary of Progress/Problems:  Chaplain was here to lead a group on themes of hope and courage. "It's difficult to find strength in myself.  I do have hope that I can find more strength within myself.  I see people doing it here."  Cited family members as a source of hope as well.  Talked about her struggles with social situations-feels discomfort.  "I don't want to bother or upset others."  "I am a work in progress trying to find balance."  Lots of vague statements. Jennifer [name], Jennifer [name] 07/11/2015 12:28 PM

## 2015-07-11 NOTE — Progress Notes (Signed)
D: Pt in bed resting. Respirations even and unlabored. Pt appears to be in no signs of distress at this time. A: Q15min checks remains for this pt. R: Pt remains safe at this time.   

## 2015-07-12 DIAGNOSIS — F200 Paranoid schizophrenia: Principal | ICD-10-CM

## 2015-07-12 LAB — LIPID PANEL
CHOL/HDL RATIO: 4.4 ratio
Cholesterol: 229 mg/dL — ABNORMAL HIGH (ref 0–200)
HDL: 52 mg/dL (ref >40)
LDL Cholesterol: 155 mg/dL — ABNORMAL HIGH (ref 0–99)
TRIGLYCERIDES: 111 mg/dL (ref <150)
VLDL: 22 mg/dL (ref 0–40)

## 2015-07-12 LAB — TSH: TSH: 1.67 u[IU]/mL (ref 0.350–4.500)

## 2015-07-12 MED ORDER — IBUPROFEN 400 MG PO TABS: 400.0000 mg | ORAL_TABLET | Freq: Four times a day (QID) | ORAL | Status: DC | PRN

## 2015-07-12 NOTE — [facility] Group Notes (Signed)
[facility] Group Notes:  (Clinical Social Work)  07/12/2015  11:15-12:00PM  Summary of Progress/Problems:   Today's process group involved patients discussing their feelings related to being hospitalized, as well as how they can use their present feelings to create a goal for the day. The patient expressed her primary feeling about being hospitalized is "okay" and she conveyed that having a schedule is very important for her, and the hospital is helping to establish that schedule.  She stated there are things she wants to do, and going about them through a routine will be helpful to her.  Type of Therapy:  Group Therapy - Process  Participation Level:  Active  Participation Quality:  Attentive  Affect:  Flat  Cognitive:  Confused  Insight:  Improving  Engagement in Therapy:  Improving  Modes of Intervention:  Exploration, Discussion  Ambrose MantleMareida Grossman-Orr, LCSW 07/12/2015, 2:29 PM

## 2015-07-12 NOTE — Progress Notes (Signed)
D: Patient's mom came visiting. Patient seen on hallway crying stated "I can't remember anything". Patient mom held her and reassure her. Complained of sore feet. Feet noted to be swollen (+) and bruised. Patient couldn't recall what happened. - DR. to review in the morning. Patient denies SI, AH/VH at this time. Patient compliant and cooperative. Sleeping at this time. A: Staff offered support and encouragement to patient. Due/prn medication given as ordered. Every 15 minutes check for safety maintained. will continue to monitor patient.  R: Patient remains safe.

## 2015-07-12 NOTE — Progress Notes (Signed)
D: Patient doing much better. Cheerful and excited seeing parents. Patient stated "I am excited and full of energy. I feel like doing some stuff to keep my self busy". Denies pain, SI, AH/VH at this time. No behavioral issues noted. A: Staff offered some crayons, pencils and paper to patient. Offered support and encouragement. Due meds given as ordered. Every 15 minutes check for safety maintained. Will continue to monitor patient for safety and stability.  R: Patient receptive to treatments and nursing interventions.

## 2015-07-12 NOTE — Progress Notes (Signed)
[facility]  07/12/2015 2:08 PM Jennifer PaganiniHannah L [name]  MRN:  [id number] Subjective:  Pt states her anxiety is improving. Anxiety is now 6/10. She is feeling better. Feels that this hospital is good and understands how to treat her. She wants to be with her family soon. Reports her period is about to start soon. Typically she experiences mood lability, impulsivity and increased anxiety. Music usually helps her feel better. Sleep is good. Appetite is fair but she doesn't like the food here. Denies depression. Denies SI/HI/AVH. States meds are helping and she denies SE.   Reports she hit her toe somewhere and it is swollen. Pt was advised to elevate and ice her foot.   O: pt seen and chart reviewed. Pt has been attending groups and is non disruptive on the unit.  Principal Problem: Paranoid schizophrenia (HCC) Diagnosis:   Patient Active Problem List   Diagnosis Date Noted  . 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 History of attention deficit hyperactivity disorder (ADHD) [Z86.59] 07/11/2015  . Paranoid schizophrenia (HCC) [F20.0] 07/10/2015  . Motor tic disorder [F95.9] 01/07/2015  . 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 Fibromyalgia [M79.7] 09/23/2014  . Disturbance of skin sensation [R20.9] 12/04/2013  . Body mass index, pediatric, 85th percentile to less than 95th percentile for age [facility][Z68.53] 01/14/2013  . Essential and other specified forms of tremor [G25.0, G25.2] 12/04/2012  . Myalgia and myositis, unspecified [IMO0001] 12/04/2012  . Laxity of ligament [M24.20] 12/04/2012   Total Time spent with patient: 20 minutes  Past Psychiatric History: see H&P  Past Medical History:  Past Medical History  Diagnosis Date  . Asthma   . Migraines     no aura  . Occasional tremors   . Environmental allergies     seasonal  . Fibromyalgia   . Memory loss   . Disturbance of skin sensation 12/04/2013  . 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 No pertinent  past surgical history. Family History:  Family History  Problem Relation Age of Onset  . Breast cancer Mother     BRAC negative  age 23  . Diabetes Father   . Hypertension Father   . Thyroid disease Maternal Aunt   . Breast cancer Maternal Aunt 45    BRAC negative  . Thyroid disease Maternal Grandmother   . Alzheimer's disease Maternal Grandfather   . Lung cancer Paternal Grandmother   . Depression Paternal Grandmother   . Heart attack Paternal Grandfather   . Depression Paternal Grandfather   . Tremor Neg Hx    Family Psychiatric  History: see H&P Social History:  History  Alcohol Use No     History  Drug Use No    Social History   Social History  . Marital Status: Single    Spouse Name: N/A  . Number of Children: 0  . Years of Education: college/st   Occupational History  . STUDENT    Social History Main Topics  . Smoking status: Never Smoker   . Smokeless tobacco: Never Used  . Alcohol Use: No  . Drug Use: No  . Sexual Activity: No     Comment: never sexually active   Other Topics Concern  . None   Social History Narrative   Patient is right handed.   Patient does not drink caffeine.   Patient lives at home with family.   Additional Social History:  Sleep: Good  Appetite:  Fair  Current Medications: Current Facility-Administered Medications  Medication Dose Route Frequency Provider Last Rate Last Dose  . acetaminophen (TYLENOL) tablet 650 mg  650 mg Oral Q6H PRN Earney Navy, NP      . albuterol (PROVENTIL HFA;VENTOLIN HFA) 108 (90 Base) MCG/ACT inhaler 1 puff  1 puff Inhalation Q6H PRN Earney Navy, NP      . alum & mag hydroxide-simeth (MAALOX/MYLANTA) 200-200-20 MG/5ML suspension 30 mL  30 mL Oral Q4H PRN Earney Navy, NP      . amantadine (SYMMETREL) capsule 100 mg  100 mg Oral BID Jomarie Longs, MD   100 mg at 07/12/15 0818  . FLUoxetine (PROZAC) capsule 40 mg  40 mg Oral Daily Earney Navy, NP   40 mg at 07/12/15 0819  . LORazepam (ATIVAN) tablet 1 mg  1 mg Oral Q6H PRN Jomarie Longs, MD       Or  . LORazepam (ATIVAN) injection 1 mg  1 mg Intramuscular Q6H PRN Saramma Eappen, MD      . magnesium hydroxide (MILK OF MAGNESIA) suspension 30 mL  30 mL Oral Daily PRN Earney Navy, NP      . pregabalin (LYRICA) capsule 100 mg  100 mg Oral TID Earney Navy, NP   100 mg at 07/12/15 1355  . propranolol (INDERAL) tablet 20 mg  20 mg Oral q morning - 10a Earney Navy, NP   20 mg at 07/12/15 1353  . propranolol (INDERAL) tablet 40 mg  40 mg Oral QHS Earney Navy, NP   40 mg at 07/11/15 2113  . QUEtiapine (SEROQUEL) tablet 25 mg  25 mg Oral QHS Jomarie Longs, MD   25 mg at 07/11/15 2113    Lab Results:  Results for orders placed or performed during the hospital encounter of 07/10/15 (from the past 48 hour(s))  TSH     Status: None   Collection Time: 07/12/15  6:27 AM  Result Value Ref Range   TSH 1.670 0.350 - 4.500 uIU/mL    Comment: Performed at [facility]  Lipid panel     Status: Abnormal   Collection Time: 07/12/15  6:27 AM  Result Value Ref Range   Cholesterol 229 (H) 0 - 200 mg/dL   Triglycerides 161 <096 mg/dL   HDL 52 >04 mg/dL   Total CHOL/HDL Ratio 4.4 RATIO   VLDL 22 0 - 40 mg/dL   LDL Cholesterol 540 (H) 0 - 99 mg/dL    Comment:        Total Cholesterol/HDL:CHD Risk Coronary Heart Disease Risk Table                     Men   Women  1/2 Average Risk   3.4   3.3  Average Risk       5.0   4.4  2 X Average Risk   9.6   7.1  3 X Average Risk  23.4   11.0        Use the calculated Patient Ratio above and the CHD Risk Table to determine the patient's CHD Risk.        ATP III CLASSIFICATION (LDL):  <100     mg/dL   Optimal  981-191  mg/dL   Near or Above                    Optimal  130-159  mg/dL  Borderline  160-189  mg/dL   High  >109     mg/dL   Very High Performed at [facility]     Blood  Alcohol level:  Lab Results  Component Value Date   [facility] <5 07/09/2015   [facility]  11/19/2006    <5        LOWEST DETECTABLE LIMIT FOR SERUM ALCOHOL IS 11 mg/dL FOR MEDICAL PURPOSES ONLY    Physical Findings: AIMS: Facial and Oral Movements Muscles of Facial Expression: None, normal Lips and Perioral Area: None, normal Jaw: None, normal Tongue: None, normal,Extremity Movements Upper (arms, wrists, hands, fingers): None, normal Lower (legs, knees, ankles, toes): None, normal, Trunk Movements Neck, shoulders, hips: None, normal, Overall Severity Severity of abnormal movements (highest score from questions above): None, normal Incapacitation due to abnormal movements: None, normal Patient's awareness of abnormal movements (rate only patient's report): No Awareness, Dental Status Current problems with teeth and/or dentures?: No Does patient usually wear dentures?: No  CIWA:    COWS:     Musculoskeletal: Strength & Muscle Tone: within normal limits Gait & Station: normal Patient leans: straight  Psychiatric Specialty Exam: Review of Systems  Gastrointestinal: Positive for abdominal pain.  Musculoskeletal: Positive for joint pain.  Psychiatric/Behavioral: Positive for depression. Negative for suicidal ideas and hallucinations. The patient is nervous/anxious. The patient does not have insomnia.     Blood pressure 109/69, pulse 87, temperature 99 F (37.2 C), temperature source Oral, resp. rate 16, height  (1.626 m), weight 82.101 kg (181 lb), last menstrual period 06/25/2015, SpO2 94 %.Body mass index is 31.05 kg/(m^2).  General Appearance: Casual  Eye Contact::  Fair  Speech:  Clear and Coherent and Normal Rate  Volume:  Normal  Mood:  Anxious  Affect:  Congruent  Thought Process:  Irrelevant and Loose  Orientation:  Full (Time, Place, and Person)  Thought Content:  Delusions, Paranoid Ideation and Rumination  Suicidal Thoughts:  No  Homicidal Thoughts:  No  Memory:   Immediate;   Fair Recent;   Fair Remote;   Fair  Judgement:  Impaired  Insight:  Shallow  Psychomotor Activity:  Normal  Concentration:  Poor  Recall:  [employer] of Knowledge:Good  Language: Fair  Akathisia:  No  Handed:  Right  AIMS (if indicated):     Assets:  Desire for Improvement Social Support  ADL's:  Intact  Cognition: WNL  Sleep:  Number of Hours: 6.75   Treatment Plan Summary: Daily contact with patient to assess and evaluate symptoms and progress in treatment and Medication management    Seroquel 25 mg po qhs for psychosis , plan to increase slowly - it has low EPS profile . Will continue Prozac 40 mg po daily for affective sx. Will add Amantadine 100 mg po bid for tremors . Will continue Propranolol as scheduled for tremors - her home medication. Will restart home medications where indicated. Will continue to monitor vitals ,medication compliance and treatment side effects while patient is here.  Will monitor for medical issues as well as call consult as needed.  Reviewed labs cbc - wnl, cmp - wnl , UDS- negative , BAL<5 , will order TSH, lipid panel, hba1c, PL ,EKG for qtc . CSW will start working on disposition. Will need to expand collateral information from family. Patient to participate in therapeutic milieu .    Oletta Darter, MD 07/12/2015, 2:08 PM

## 2015-07-12 NOTE — Progress Notes (Signed)
D-  Patient has been compliant with treatment this shift.  Patient took medications, attended all groups, and interacted appropriately with staff and peers.  Patient had no incidents of behavioral dyscontrol and denies SI, HI and AVH.  A-  Assess patient for safety, offer medications as prescribed, engage patient in 1:1 therapeutic staff talks.  R-  Patient able to contract for safety, continue to monitor for safety.  

## 2015-07-12 NOTE — Progress Notes (Signed)
Adult Psychoeducational Group Note  Date:  07/12/2015 Time:  9:04 PM  Group Topic/Focus:  Wrap-Up Group:   The focus of this group is to help patients review their daily goal of treatment and discuss progress on daily workbooks.  Participation Level:  Active  Participation Quality:  Appropriate  Affect:  Appropriate  Cognitive:  Oriented  Insight: Appropriate  Engagement in Group:  Engaged  Modes of Intervention:  Socialization and Support  Additional Comments:  Patient attended and participated in group tonight. She reports having an OK day. She did everything as schedule. Her parents visited today which was a good visit.   Lita MainsFrancis, Erleen Egner [facility] 07/12/2015, 9:04 PM

## 2015-07-13 LAB — PROLACTIN: Prolactin: 14.5 ng/mL (ref 4.8–23.3)

## 2015-07-13 MED ORDER — QUETIAPINE FUMARATE 50 MG PO TABS
50.0000 mg | ORAL_TABLET | Freq: Every day | ORAL | Status: DC
  Administered 2015-07-13: 50 mg via ORAL
  Filled 2015-07-13 (×4): qty 1

## 2015-07-13 NOTE — Progress Notes (Signed)
[facility] MD Progress Note  07/13/2015 12:40 PM Jennifer [name]  MRN:  [id number] Subjective:  Pt states she is feeling sick and still feeling bad. Depression and anxiety continue. Reports her period is ongoing and she experiencing mood lability and increased anxiety. States talking to people is hard and she thinks the staff does not like her.  Sleep is good. Appetite is fair but she doesn't like the food here. Denies SI/HI/AVH. States meds are helping and she denies SE.   Reports she hit her toe somewhere and it is swollen. Pt was advised to elevate and ice her foot.   O: pt seen and chart reviewed. Pt has been attending groups and is non disruptive on the unit. Pt was seen in group today.  Principal Problem: Paranoid schizophrenia (HCC) Diagnosis:   Patient Active Problem List   Diagnosis Date Noted  . 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 History of attention deficit hyperactivity disorder (ADHD) [Z86.59] 07/11/2015  . Paranoid schizophrenia (HCC) [F20.0] 07/10/2015  . Motor tic disorder [F95.9] 01/07/2015  . 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 Fibromyalgia [M79.7] 09/23/2014  . Disturbance of skin sensation [R20.9] 12/04/2013  . Body mass index, pediatric, 85th percentile to less than 95th percentile for age [facility] 01/14/2013  . Essential and other specified forms of tremor [G25.0, G25.2] 12/04/2012  . Myalgia and myositis, unspecified [IMO0001] 12/04/2012  . Laxity of ligament [M24.20] 12/04/2012   Total Time spent with patient: 20 minutes  Past Psychiatric History: see H&P  Past Medical History:  Past Medical History  Diagnosis Date  . Asthma   . Migraines     no aura  . Occasional tremors   . Environmental allergies     seasonal  . Fibromyalgia   . Memory loss   . Disturbance of skin sensation 12/04/2013  . 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 No pertinent past surgical history. Family History:  Family History  Problem Relation Age  of Onset  . Breast cancer Mother     BRAC negative  age 76  . Diabetes Father   . Hypertension Father   . Thyroid disease Maternal Aunt   . Breast cancer Maternal Aunt 45    BRAC negative  . Thyroid disease Maternal Grandmother   . Alzheimer's disease Maternal Grandfather   . Lung cancer Paternal Grandmother   . Depression Paternal Grandmother   . Heart attack Paternal Grandfather   . Depression Paternal Grandfather   . Tremor Neg Hx    Family Psychiatric  History: see H&P Social History:  History  Alcohol Use No     History  Drug Use No    Social History   Social History  . Marital Status: Single    Spouse Name: N/A  . Number of Children: 0  . Years of Education: college/st   Occupational History  . STUDENT    Social History Main Topics  . Smoking status: Never Smoker   . Smokeless tobacco: Never Used  . Alcohol Use: No  . Drug Use: No  . Sexual Activity: No     Comment: never sexually active   Other Topics Concern  . None   Social History Narrative   Patient is right handed.   Patient does not drink caffeine.   Patient lives at home with family.   Additional Social History:                         Sleep: Good  Appetite:  Fair  Current Medications: Current Facility-Administered Medications  Medication Dose Route Frequency Provider Last Rate Last Dose  . acetaminophen (TYLENOL) tablet 650 mg  650 mg Oral Q6H PRN Earney Navy, NP      . albuterol (PROVENTIL HFA;VENTOLIN HFA) 108 (90 Base) MCG/ACT inhaler 1 puff  1 puff Inhalation Q6H PRN Earney Navy, NP      . alum & mag hydroxide-simeth (MAALOX/MYLANTA) 200-200-20 MG/5ML suspension 30 mL  30 mL Oral Q4H PRN Earney Navy, NP      . amantadine (SYMMETREL) capsule 100 mg  100 mg Oral BID Jomarie Longs, MD   100 mg at 07/13/15 0735  . FLUoxetine (PROZAC) capsule 40 mg  40 mg Oral Daily Earney Navy, NP   40 mg at 07/13/15 0735  . ibuprofen (ADVIL,MOTRIN) tablet 400 mg   400 mg Oral Q6H PRN Oletta Darter, MD      . LORazepam (ATIVAN) tablet 1 mg  1 mg Oral Q6H PRN Jomarie Longs, MD       Or  . LORazepam (ATIVAN) injection 1 mg  1 mg Intramuscular Q6H PRN Saramma Eappen, MD      . magnesium hydroxide (MILK OF MAGNESIA) suspension 30 mL  30 mL Oral Daily PRN Earney Navy, NP      . pregabalin (LYRICA) capsule 100 mg  100 mg Oral TID Earney Navy, NP   100 mg at 07/13/15 0735  . propranolol (INDERAL) tablet 20 mg  20 mg Oral q morning - 10a Earney Navy, NP   20 mg at 07/12/15 1353  . propranolol (INDERAL) tablet 40 mg  40 mg Oral QHS Earney Navy, NP   40 mg at 07/12/15 2116  . QUEtiapine (SEROQUEL) tablet 25 mg  25 mg Oral QHS Jomarie Longs, MD   25 mg at 07/12/15 2116    Lab Results:  Results for orders placed or performed during the hospital encounter of 07/10/15 (from the past 48 hour(s))  TSH     Status: None   Collection Time: 07/12/15  6:27 AM  Result Value Ref Range   TSH 1.670 0.350 - 4.500 uIU/mL    Comment: Performed at [facility]  Lipid panel     Status: Abnormal   Collection Time: 07/12/15  6:27 AM  Result Value Ref Range   Cholesterol 229 (H) 0 - 200 mg/dL   Triglycerides 098 <119 mg/dL   HDL 52 >14 mg/dL   Total CHOL/HDL Ratio 4.4 RATIO   VLDL 22 0 - 40 mg/dL   LDL Cholesterol 782 (H) 0 - 99 mg/dL    Comment:        Total Cholesterol/HDL:CHD Risk Coronary Heart Disease Risk Table                     Men   Women  1/2 Average Risk   3.4   3.3  Average Risk       5.0   4.4  2 X Average Risk   9.6   7.1  3 X Average Risk  23.4   11.0        Use the calculated Patient Ratio above and the CHD Risk Table to determine the patient's CHD Risk.        ATP III CLASSIFICATION (LDL):  <100     mg/dL   Optimal  956-213  mg/dL   Near or Above  Optimal  130-159  mg/dL   Borderline  [id number]  mg/dL   High  >914>190     mg/dL   Very High [facility]   Prolactin      Status: None   Collection Time: 07/12/15  6:27 AM  Result Value Ref Range   Prolactin 14.5 4.8 - 23.3 ng/mL    Comment: (NOTE) Performed At: [facility] [address] [place], [place] [id number] Mila HomerHancock William F MD YQ:[id number]:[phone] [facility]formed [facility]     Blood Alcohol level:  Lab Results  Component Value Date   [facility] 07/09/2015   [facility]  [facility]    <5        LOWEST DETECTABLE LIMIT FOR SERUM ALCOHOL IS 11 mg/dL FOR MEDICAL PURPOSES ONLY    Physical Findings: AIMS: Facial and Oral Movements Muscles of Facial Expression: None, normal Lips and Perioral Area: None, normal Jaw: None, normal Tongue: None, normal,Extremity Movements Upper (arms, wrists, hands, fingers): None, normal Lower (legs, knees, ankles, toes): None, normal, Trunk Movements Neck, shoulders, hips: None, normal, Overall Severity Severity of abnormal movements (highest score from questions above): None, normal Incapacitation due to abnormal movements: None, normal Patient's awareness of abnormal movements (rate only patient's report): No Awareness, Dental Status Current problems with teeth and/or dentures?: No Does patient usually wear dentures?: No  CIWA:    COWS:     Musculoskeletal: Strength & Muscle Tone: within normal limits Gait & Station: normal Patient leans: straight  Psychiatric Specialty Exam: Review of Systems  Gastrointestinal: Positive for abdominal pain.  Musculoskeletal: Positive for joint pain.  Psychiatric/Behavioral: Positive for depression. Negative for suicidal ideas and hallucinations. The patient is nervous/anxious. The patient does not have insomnia.     Blood pressure 112/68, pulse 95, temperature 99.2 F (37.3 C), temperature source Oral, resp. rate 20, height 5\' 4"  (1.626 m), weight 82.101 kg (181 lb), last menstrual period 06/25/2015, SpO2 94 %.Body mass index is 31.05 kg/(m^2).  General Appearance: Casual  Eye Contact::  Fair  Speech:   Clear and Coherent and Normal Rate  Volume:  Normal  Mood:  Anxious  Affect:  Congruent  Thought Process:  Irrelevant and Loose  Orientation:  Full (Time, Place, and Person)  Thought Content:  Delusions and Paranoid Ideation  Suicidal Thoughts:  No  Homicidal Thoughts:  No  Memory:  Immediate;   Fair Recent;   Fair Remote;   Fair  Judgement:  Impaired  Insight:  Shallow  Psychomotor Activity:  Normal  Concentration:  Poor- improving  Recall:  [employer]  Fund of Knowledge:Good  Language: Fair  Akathisia:  No  Handed:  Right  AIMS (if indicated):     Assets:  Desire for Improvement Social Support  ADL's:  Intact  Cognition: WNL  Sleep:  Number of Hours: 6.75   Treatment Plan Summary: Daily contact with patient to assess and evaluate symptoms and progress in treatment and Medication management    increase Seroquel to 50 mg po qhs for psychosis , plan to increase slowly - it has low EPS profile . Will continue Prozac 40 mg po daily for affective sx. Will add Amantadine 100 mg po bid for tremors . Will continue Propranolol as scheduled for tremors - her home medication. Will restart home medications where indicated. Will continue to monitor vitals ,medication compliance and treatment side effects while patient is here.  Will monitor for medical issues as well as call consult as needed.  Reviewed labs cbc -  wnl, cmp - wnl , UDS- negative , BAL<5 , will order TSH, lipid panel, hba1c, PL ,EKG for qtc . CSW will start working on disposition. Will need to expand collateral information from family. Patient to participate in therapeutic milieu .    Oletta Darter, MD 07/13/2015, 12:40 PM

## 2015-07-13 NOTE — Progress Notes (Signed)
D: Pt denies SI/HI/AVH. Pt is pleasant and cooperative. Pt stated she felt better, happy and trying to get more involved.   A: Pt was offered support and encouragement. Pt was given scheduled medications. Pt was encourage to attend groups. Q 15 minute checks were done for safety.   R:Pt attends groups and interacts well with peers and staff. Pt is taking medication. Pt has no complaints at this time.Pt receptive to treatment and safety maintained on unit.

## 2015-07-13 NOTE — Progress Notes (Signed)
D-  Patient has been compliant with treatment this shift.  Patient took medications, attended all groups, and interacted appropriately with staff and peers.  Patient had no incidents of behavioral dyscontrol and denies SI, HI and AVH.  A-  Assess patient for safety, offer medications as prescribed, engage patient in 1:1 therapeutic staff talks.  R-  Patient able to contract for safety, continue to monitor for safety.  

## 2015-07-13 NOTE — [facility] Group Notes (Signed)
Patient said her day was a 8 thru 359. She feels better happier. Her parent s visit today.

## 2015-07-13 NOTE — [facility] Group Notes (Signed)
[facility] Group Notes:  (Clinical Social Work)  07/13/2015  11:00AM-12:00PM  Summary of Progress/Problems:  The main focus of today's process group was to listen to a variety of genres of music and to identify that different types of music provoke different responses.  The patient then was able to identify personally what was soothing for them, as well as energizing, as well as how patient can personally use this knowledge in sleep habits, with depression, and with other symptoms.  The patient expressed at the beginning of group the overall feeling of "sick" and looked like she felt sick.  Throughout group she danced, even getting out of her seat to dance at one point, and played various "air" instruments including piano, drums, and guitar.  She also seemed to be responding to internal stimuli at times, making motions in the air and talking.  At the end of group she said she felt less sick, but nonetheless still sick.  Type of Therapy:  Music Therapy   Participation Level:  Active  Participation Quality:  Attentive  Affect:  Flat  Cognitive:  Hallucinating  Insight:  Improving  Engagement in Therapy:  Engaged  Modes of Intervention:   Activity, Exploration  Ambrose MantleMareida Grossman-Orr, LCSW 07/13/2015

## 2015-07-13 NOTE — Plan of Care (Signed)
Problem: Ineffective individual coping Goal: STG: Patient will remain free from self harm Outcome: Progressing Pt safe on the unit at this time     

## 2015-07-14 LAB — HEMOGLOBIN A1C
HEMOGLOBIN A1C: 5.4 % (ref 4.8–5.6)
Mean Plasma Glucose: 108 mg/dL

## 2015-07-14 MED ORDER — QUETIAPINE FUMARATE 50 MG PO TABS: 50.0000 mg | ORAL_TABLET | Freq: Every day | ORAL | Status: DC

## 2015-07-14 MED ORDER — AMANTADINE HCL 100 MG PO CAPS: 100.0000 mg | ORAL_CAPSULE | Freq: Two times a day (BID) | ORAL | Status: DC

## 2015-07-14 MED ORDER — FLUOXETINE HCL 40 MG PO CAPS: 40.0000 mg | ORAL_CAPSULE | Freq: Every day | ORAL | Status: DC
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
## 2015-07-14 NOTE — [facility] Suicide Risk Assessment (Signed)
[facility]   Principal Problem: Paranoid schizophrenia [facility]) Discharge Diagnoses:  Patient Active Problem List   Diagnosis Date Noted  . 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 History of attention deficit hyperactivity disorder (ADHD) [Z86.59] 07/11/2015  . Paranoid schizophrenia ([facility]) [F20.0] 07/10/2015  . Motor tic disorder [F95.9] 01/07/2015  . 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 Fibromyalgia [M79.7] 09/23/2014  . Disturbance of skin sensation [R20.9] 12/04/2013  . Body mass index, pediatric, 85th percentile to less than 95th percentile for age [facility][Z68.53] 01/14/2013  . Essential and other specified forms of tremor [G25.0, G25.2] 12/04/2012  . Myalgia and myositis, unspecified [IMO0001] 12/04/2012  . Laxity of ligament [M24.20] 12/04/2012    Total Time spent with patient: 30 minutes  Musculoskeletal: Strength & Muscle Tone: within normal limits Gait & Station: normal Patient leans: N/A  Psychiatric Specialty Exam: Review of Systems  Neurological: Positive for tremors (CHRONIC).  Psychiatric/Behavioral: Negative for depression, suicidal ideas and hallucinations. The patient is not nervous/anxious and does not have insomnia.   All other systems reviewed and are negative.   Blood pressure 115/65, pulse 98, temperature 98.2 F (36.8 C), temperature source Oral, resp. rate 18, height 5\' 4"  (1.626 m), weight 82.101 kg (181 lb), last menstrual period 06/25/2015, SpO2 94 %.Body mass index is 31.05 kg/(m^2).  General Appearance: Casual  Eye Contact::  Fair  Speech:  Clear and Coherent409  Volume:  Normal  Mood:  Euthymic  Affect:  Congruent  Thought Process:  Goal Directed  Orientation:  Full (Time, Place, and Person)  Thought Content:  Delusions Has chronic delusions - not preoccupied or focussed on it  Suicidal Thoughts:  No  Homicidal Thoughts:  No  Memory:  Immediate;   Fair Recent;   Fair Remote;   Fair  Judgement:  Fair   Insight:  Fair  Psychomotor Activity:  Tremor  Concentration:  Fair  Recall:  FiservFair  Fund of Knowledge:Fair  Language: Fair  Akathisia:  No  Handed:  Right  AIMS (if indicated):   6 - due to chronic tremors BL hands - ongoing for several years - likely essential tremors.  Assets:  Desire for Improvement  Sleep:  Number of Hours: 6.75  Cognition: WNL  ADL's:  Intact   Mental Status Per Nursing Assessment::   On Admission:   (denies all)  Demographic Factors:  Caucasian  Loss Factors: NA  Historical Factors: Impulsivity  Risk Reduction Factors:   Positive social support  Continued Clinical Symptoms:  Previous Psychiatric Diagnoses and Treatments  Cognitive Features That Contribute To Risk:  Polarized thinking    Suicide Risk:  Minimal: No identifiable suicidal ideation.  Patients presenting with no risk factors but with morbid ruminations; may be classified as minimal risk based on the severity of the depressive symptoms    Plan Of Care/Follow-up recommendations:  Activity:  no restrictions Diet:  regular Tests:  as needed Other:  follow up with aftercare  Jennifer Costilla, MD 07/14/2015, 9:51 AM

## 2015-07-14 NOTE — Progress Notes (Signed)
  [facility] :  Will you be returning to the same living situation after discharge:  Yes,  home At discharge, do you have transportation home?: Yes,  family Do you have the ability to pay for your medications: Yes,  insurance  Release of information consent forms completed and in the chart;  Patient's signature needed at discharge.  Patient to Follow up at: Follow-up Information    [facility].   Why:  I left a message for Eloisa NorthernCarol Gilbert re: your stay in the hospital and a request for an appointment time and date.   Contact information:   [address]  [place] [phone]      Next level of care provider has [facility] Link:no  Safety Planning and Suicide Prevention discussed: Yes,  ye  Have you used any form of tobacco in the last 30 days? (Cigarettes, Smokeless Tobacco, Cigars, and/or Pipes): No  Has patient been referred to the Quitline?: N/A patient is not a smoker  Patient has been referred for addiction treatment: N/A  Daryel Geraldorth, Niamya Vittitow B 07/14/2015, 10:03 AM

## 2015-07-14 NOTE — [facility] Suicide Risk Assessment (Signed)
[facility] INPATIENT:  Family/Significant Other Suicide Prevention Education  Suicide Prevention Education:  Education Completed; No one has been identified by the patient as the family member/significant other with whom the patient will be residing, and identified as the person(s) who will aid the patient in the event of a mental health crisis (suicidal ideations/suicide attempt).  With written consent from the patient, the family member/significant other has been provided the following suicide prevention education, prior to the and/or following the discharge of the patient.  The suicide prevention education provided includes the following:  Suicide risk factors  Suicide prevention and interventions  National Suicide Hotline telephone number  [facility] assessment telephone number  [facility] Emergency Assistance 911  [facility] and/or Residential Mobile Crisis Unit telephone number  Request made of family/significant other to:  Remove weapons (e.g., guns, rifles, knives), all items previously/currently identified as safety concern.    Remove drugs/medications (over-the-counter, prescriptions, illicit drugs), all items previously/currently identified as a safety concern.  The family member/significant other verbalizes understanding of the suicide prevention education information provided.  The family member/significant other agrees to remove the items of safety concern listed above. The patient did not endorse SI at the time of admission, nor did the patient c/o SI during the stay here.  SPE not required. However, I did talk to mother, Jennifer [name], [phone], about a crises plan.  Daryel Geraldorth, Lucio Litsey B 07/14/2015, 10:34 AM

## 2015-07-14 NOTE — Progress Notes (Signed)
Discharge note: Pt received both written and verbal discharge instructions. Pt verbalized understanding of discharge instructions. Pt agreed to f/u appt and med regimen. Pt received SRA, AVS, Trans record and prescriptions. Pt received belonging from room and locker. Pt safely discharged to lobby and picked up by her mother.
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
## 2015-09-23 ENCOUNTER — Telehealth: Payer: Self-pay

## 2015-09-23 ENCOUNTER — Ambulatory Visit (INDEPENDENT_AMBULATORY_CARE_PROVIDER_SITE_OTHER): Payer: BLUE CROSS/BLUE SHIELD | Admitting: Certified Nurse Midwife

## 2015-09-23 ENCOUNTER — Encounter: Payer: Self-pay | Admitting: Certified Nurse Midwife

## 2015-09-23 VITALS — BP 96/62 | HR 70 | Resp 16 | Ht 64.75 in | Wt 176.0 lb

## 2015-09-23 DIAGNOSIS — Z0000 Encounter for general adult medical examination without abnormal findings: Secondary | ICD-10-CM | POA: Diagnosis not present

## 2015-09-23 DIAGNOSIS — Z01419 Encounter for gynecological examination (general) (routine) without abnormal findings: Secondary | ICD-10-CM

## 2015-09-23 DIAGNOSIS — Z3041 Encounter for surveillance of contraceptive pills: Secondary | ICD-10-CM

## 2015-09-23 LAB — POCT URINALYSIS DIPSTICK
BILIRUBIN UA: NEGATIVE
Glucose, UA: NEGATIVE
KETONES UA: NEGATIVE
Nitrite, UA: NEGATIVE
PH UA: 5
Protein, UA: NEGATIVE
RBC UA: NEGATIVE
Urobilinogen, UA: NEGATIVE

## 2015-09-23 MED ORDER — DESOGEST-ETH ESTRAD TRIPHASIC 0.1/0.125/0.15 -0.025 MG PO TABS: ORAL_TABLET | ORAL | Status: DC

## 2015-09-23 NOTE — Progress Notes (Signed)
25 y.o. G0P0000 Single  Caucasian Fe here for annual exam. Periods normal and regular and minimal cramping with OCP use for cycle control.. Mother administering medication now since hospitalization. Was hospitalized for total mental breakdown in 4/17 and had medication change, feeling better. Mother with patient per patient request, agreeable to presence and all conversation and management. Patient seeing Psychiatrist and mental FNP weekly for management. Patient having urge to having sexual activity through thoughts at bedtime  no urges to act on. Upon further discussion she is having which sounds like vaginal tremors. Denies self stimulation, which she knows is OK(per her mother). Patient just felt uncomfortable when this happened. Denies any sexual contact or touching from other persons. Sees Neurology for follow up also of tremors. Recent PCP visit (mother works for PCP) and had labs all normal. Has been having constipation issues, but medication change has corrected this somewhat. No concerns today per mother or patient. No other health issues today. Patient very sleepy appearing today, but answers questions without hesitation. Some heat and cold intolerance with new medication. ? Thyroid issue. No other health issues today.  Patient's last menstrual period was 09/08/2015.          Sexually active: No.  The current method of family planning is OCP (estrogen/progesterone).    Exercising: No.  exercise Smoker:  no  Health Maintenance: Pap:  09-20-14 neg MMG: none Colonoscopy:  none BMD:   none TDaP:  2008 Shingles: no Pneumonia: no Hep C and HIV: not done Labs: poct urine-wbc1+, no rbcs noted Self breast exam: done occ  reports that she has never smoked. She has never used smokeless tobacco. She reports that she does not drink alcohol or use illicit drugs.  Past Medical History  Diagnosis Date  . Asthma   . Migraines     no aura  . Occasional tremors   . Environmental allergies    seasonal  . Fibromyalgia   . Memory loss   . Disturbance of skin sensation 12/04/2013  . 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 Schizophrenia (HCC)     History reviewed. No pertinent past surgical history.  Current Outpatient Prescriptions  Medication Sig Dispense Refill  . amantadine (SYMMETREL) 100 MG capsule Take 1 capsule (100 mg total) by mouth 2 (two) times daily. 60 capsule 0  . desogestrel-ethinyl estradiol (VELIVET) 0.1/0.125/0.15 -0.025 MG tablet TAKE 1 TABLET DAILY (Patient taking differently: Take 1 tablet by mouth daily. ) 84 tablet 4  . fexofenadine (ALLEGRA) 180 MG tablet Take 180 mg by mouth daily as needed for allergies.     Marland Kitchen. FLUoxetine (PROZAC) 20 MG capsule TK 1 C PO Q MORNING AND 1 C D AT NOON  2  . LYRICA 100 MG capsule One by mouth 3 times a day (Patient taking differently: Take 100 mg by mouth 3 (three) times daily. ) 93 capsule 5  . PROAIR HFA 108 (90 BASE) MCG/ACT inhaler Inhale 1 puff into the lungs every 6 (six) hours as needed for wheezing or shortness of breath.     . propranolol (INDERAL) 20 MG tablet Take 20 mg by mouth every morning.     Marland Kitchen. QUEtiapine (SEROQUEL) 50 MG tablet Take 1 tablet (50 mg total) by mouth at bedtime. 30 tablet 0   No current facility-administered medications for this visit.    Family History  Problem Relation Age of Onset  . Breast cancer Mother     BRAC negative  age 23  . Diabetes Father   .  Hypertension Father   . Thyroid disease Maternal Aunt   . Breast cancer Maternal Aunt 45    BRAC negative  . Thyroid disease Maternal Grandmother   . Alzheimer's disease Maternal Grandfather   . Lung cancer Paternal Grandmother   . Depression Paternal Grandmother   . Heart attack Paternal Grandfather   . Depression Paternal Grandfather   . Tremor Neg Hx     ROS:  Pertinent items are noted in HPI.  Otherwise, a comprehensive ROS was negative.  Exam:   BP 96/62 mmHg  Pulse 70  Resp 16  Ht 5' 4.75" (1.645 m)  Wt 176 lb  (79.833 kg)  BMI 29.50 kg/m2  LMP 09/08/2015 Height: 5' 4.75" (164.5 cm) Ht Readings from Last 3 Encounters:  09/23/15 5' 4.75" (1.645 m)  07/10/15  (1.626 m)  07/09/15  (1.702 m)    General appearance: alert, cooperative and appears stated age Head: Normocephalic, without obvious abnormality, atraumatic Neck: no adenopathy, supple, symmetrical, trachea midline and thyroid normal to inspection and palpation Lungs: clear to auscultation bilaterally Breasts: normal appearance, no masses or tenderness, No nipple retraction or dimpling, No nipple discharge or bleeding, No axillary or supraclavicular adenopathy Heart: regular rate and rhythm Abdomen: soft, non-tender; no masses,  no organomegaly Extremities: extremities normal, atraumatic, no cyanosis or edema Skin: Skin color, texture, turgor normal. No rashes or lesions Lymph nodes: Cervical, supraclavicular, and axillary nodes normal. No abnormal inguinal nodes palpated Neurologic: Grossly normal   Pelvic: External genitalia:  no lesions              Urethra:  normal appearing urethra with no masses, tenderness or lesions              Bartholin's and Skene's: normal                 Vagina: normal appearing vagina with normal color and discharge, no lesions              Cervix: no cervical motion tenderness, no lesions and nulliparous appearance              Pap taken: No. Bimanual Exam:  Uterus:  normal size, contour, position, consistency, mobility, non-tender              Adnexa: normal adnexa and no mass, fullness, tenderness               Rectovaginal: Confirms               Anus:  normal appearance  Chaperone present: yes   A:  Well Woman with normal exam  History of dysmenorrhea and menorrhagia, OCP working well to control, desires continuing  ? vaginal tremors vs normal vaginal sensation with perineal cleaning  History essential tremors, secondary parkinsonism, tic disorder under Neurology management  Paranoid  schizophrenia under Psychiatrist management       P:   Reviewed health and wellness pertinent to exam  Discussed risks, benefits of OCP, also discussed hormonal changes as a young adult, do not feel her vaginal sensations are OCP related due to has been on same OCP for more than two years. Encouraged to discuss with neurology also. Mother will follow up with.  Continue to follow up with MD as indicated. All labs done by PCP. Mother will check to see if TSH has been done, due to increase in heat/cold tolerance.  Pap smear as above  counseled on breast self exam, STD prevention, HIV risk factors and prevention,  use and side effects of OCP's, adequate intake of calcium and vitamin D, diet and exercise  return annually or prn  An After Visit Summary was printed and given to the patient.

## 2015-09-23 NOTE — Patient Instructions (Signed)
General topics  Next pap or exam is  due in 1 year Take a Women's multivitamin Take 1200 mg. of calcium daily - prefer dietary If any concerns in interim to call back  Breast Self-Awareness Practicing breast self-awareness may pick up problems early, prevent significant medical complications, and possibly save your life. By practicing breast self-awareness, you can become familiar with how your breasts look and feel and if your breasts are changing. This allows you to notice changes early. It can also offer you some reassurance that your breast health is good. One way to learn what is normal for your breasts and whether your breasts are changing is to do a breast self-exam. If you find a lump or something that was not present in the past, it is best to contact your caregiver right away. Other findings that should be evaluated by your caregiver include nipple discharge, especially if it is bloody; skin changes or reddening; areas where the skin seems to be pulled in (retracted); or new lumps and bumps. Breast pain is seldom associated with cancer (malignancy), but should also be evaluated by a caregiver. BREAST SELF-EXAM The best time to examine your breasts is 5 7 days after your menstrual period is over.  [employer] Patient Information 2013 [place].   Exercise to Stay Healthy Exercise helps you become and stay healthy. EXERCISE IDEAS AND TIPS Choose exercises that:  You enjoy.  Fit into your day. You do not need to exercise really hard to be healthy. You can do exercises at a slow or medium level and stay healthy. You can:  Stretch before and after working out.  Try yoga, Pilates, or tai chi.  Lift weights.  Walk fast, swim, jog, run, climb stairs, bicycle, dance, or rollerskate.  Take aerobic classes. Exercises that burn about 150 calories:  Running 1  miles in 15 minutes.  Playing volleyball for 45 to 60 minutes.  Washing and waxing a car for 45 to 60  minutes.  Playing touch football for 45 minutes.  Walking 1  miles in 35 minutes.  Pushing a stroller 1  miles in 30 minutes.  Playing basketball for 30 minutes.  Raking leaves for 30 minutes.  Bicycling 5 miles in 30 minutes.  Walking 2 miles in 30 minutes.  Dancing for 30 minutes.  Shoveling snow for 15 minutes.  Swimming laps for 20 minutes.  Walking up stairs for 15 minutes.  Bicycling 4 miles in 15 minutes.  Gardening for 30 to 45 minutes.  Jumping rope for 15 minutes.  Washing windows or floors for 45 to 60 minutes. Document Released: 04/03/2010 Document Revised: 05/24/2011 Document Reviewed: 04/03/2010 [facility] Patient Information 2013 [place].   Other topics ( that may be useful information):    Sexually Transmitted Disease Sexually transmitted disease (STD) refers to any infection that is passed from person to person during sexual activity. This may happen by way of saliva, semen, blood, vaginal mucus, or urine. Common STDs include:  Gonorrhea.  Chlamydia.  Syphilis.  HIV/AIDS.  Genital herpes.  Hepatitis B and C.  Trichomonas.  Human papillomavirus (HPV).  Pubic lice. CAUSES  An STD may be spread by bacteria, virus, or parasite. A person can get an STD by:  Sexual intercourse with an infected person.  Sharing sex toys with an infected person.  Sharing needles with an infected person.  Having intimate contact with the genitals, mouth, or rectal areas of an infected person. SYMPTOMS  Some people may not have any symptoms, but  they can still pass the infection to others. Different STDs have different symptoms. Symptoms include:  Painful or bloody urination.  Pain in the pelvis, abdomen, vagina, anus, throat, or eyes.  Skin rash, itching, irritation, growths, or sores (lesions). These usually occur in the genital or anal area.  Abnormal vaginal discharge.  Penile discharge in men.  Soft, flesh-colored skin growths in the  genital or anal area.  Fever.  Pain or bleeding during sexual intercourse.  Swollen glands in the groin area.  Yellow skin and eyes (jaundice). This is seen with hepatitis. DIAGNOSIS  To make a diagnosis, your caregiver may:  Take a medical history.  Perform a physical exam.  Take a specimen (culture) to be examined.  Examine a sample of discharge under a microscope.  Perform blood test TREATMENT   Chlamydia, gonorrhea, trichomonas, and syphilis can be cured with antibiotic medicine.  Genital herpes, hepatitis, and HIV can be treated, but not cured, with prescribed medicines. The medicines will lessen the symptoms.  Genital warts from HPV can be treated with medicine or by freezing, burning (electrocautery), or surgery. Warts may come back.  HPV is a virus and cannot be cured with medicine or surgery.However, abnormal areas may be followed very closely by your caregiver and may be removed from the cervix, vagina, or vulva through office procedures or surgery. If your diagnosis is confirmed, your recent sexual partners need treatment. This is true even if they are symptom-free or have a negative culture or evaluation. They should not have sex until their caregiver says it is okay. HOME CARE INSTRUCTIONS  All sexual partners should be informed, tested, and treated for all STDs.  Take your antibiotics as directed. Finish them even if you start to feel better.  Only take over-the-counter or prescription medicines for pain, discomfort, or fever as directed by your caregiver.  Rest.  Eat a balanced diet and drink enough fluids to keep your urine clear or pale yellow.  Do not have sex until treatment is completed and you have followed up with your caregiver. STDs should be checked after treatment.  Keep all follow-up appointments, Pap tests, and blood tests as directed by your caregiver.  Only use latex condoms and water-soluble lubricants during sexual activity. Do not use  petroleum jelly or oils.  Avoid alcohol and illegal drugs.  Get vaccinated for HPV and hepatitis. If you have not received these vaccines in the past, talk to your caregiver about whether one or both might be right for you.  Avoid risky sex practices that can break the skin. The only way to avoid getting an STD is to avoid all sexual activity.Latex condoms and dental dams (for oral sex) will help lessen the risk of getting an STD, but will not completely eliminate the risk. SEEK MEDICAL CARE IF:   You have a fever.  You have any new or worsening symptoms. Document Released: 05/22/2002 Document Revised: 05/24/2011 Document Reviewed: 05/29/2010 [facility] Patient Information 2013 [place].    Domestic Abuse You are being battered or abused if someone close to you hits, pushes, or physically hurts you in any way. You also are being abused if you are forced into activities. You are being sexually abused if you are forced to have sexual contact of any kind. You are being emotionally abused if you are made to feel worthless or if you are constantly threatened. It is important to remember that help is available. No one has the right to abuse you. PREVENTION OF FURTHER  ABUSE  Learn the warning signs of danger. This varies with situations but may include: the use of alcohol, threats, isolation from friends and family, or forced sexual contact. Leave if you feel that violence is going to occur.  If you are attacked or beaten, report it to the police so the abuse is documented. You do not have to press charges. The police can protect you while you or the attackers are leaving. Get the officer's name and badge number and a copy of the report.  Find someone you can trust and tell them what is happening to you: your caregiver, a nurse, clergy member, close friend or family member. Feeling ashamed is natural, but remember that you have done nothing wrong. No one deserves abuse. Document Released:  02/27/2000 Document Revised: 05/24/2011 Document Reviewed: 05/07/2010 [facility] Patient Information 2013 [place].    How Much is Too Much Alcohol? Drinking too much alcohol can cause injury, accidents, and health problems. These types of problems can include:   Car crashes.  Falls.  Family fighting (domestic violence).  Drowning.  Fights.  Injuries.  Burns.  Damage to certain organs.  Having a baby with birth defects. ONE DRINK CAN BE TOO MUCH WHEN YOU ARE:  Working.  Pregnant or breastfeeding.  Taking medicines. Ask your doctor.  Driving or planning to drive. If you or someone you know has a drinking problem, get help from a doctor.  Document Released: 12/26/2008 Document Revised: 05/24/2011 Document Reviewed: 12/26/2008 [facility] Patient Information 2013 [place].   Smoking Hazards Smoking cigarettes is extremely bad for your health. Tobacco smoke has over 200 known poisons in it. There are over 60 chemicals in tobacco smoke that cause cancer. Some of the chemicals found in cigarette smoke include:   Cyanide.  Benzene.  Formaldehyde.  Methanol (wood alcohol).  Acetylene (fuel used in welding torches).  Ammonia. Cigarette smoke also contains the poisonous gases nitrogen oxide and carbon monoxide.  Cigarette smokers have an increased risk of many serious medical problems and Smoking causes approximately:  90% of all lung cancer deaths in men.  80% of all lung cancer deaths in women.  90% of deaths from chronic obstructive lung disease. Compared with nonsmokers, smoking increases the risk of:  Coronary heart disease by 2 to 4 times.  Stroke by 2 to 4 times.  Men developing lung cancer by 23 times.  Women developing lung cancer by 13 times.  Dying from chronic obstructive lung diseases by 12 times.  . Smoking is the most preventable cause of death and disease in our society.  WHY IS SMOKING ADDICTIVE?  Nicotine is the chemical  agent in tobacco that is capable of causing addiction or dependence.  When you smoke and inhale, nicotine is absorbed rapidly into the bloodstream through your lungs. Nicotine absorbed through the lungs is capable of creating a powerful addiction. Both inhaled and non-inhaled nicotine may be addictive.  Addiction studies of cigarettes and spit tobacco show that addiction to nicotine occurs mainly during the teen years, when young people begin using tobacco products. WHAT ARE THE BENEFITS OF QUITTING?  There are many health benefits to quitting smoking.   Likelihood of developing cancer and heart disease decreases. Health improvements are seen almost immediately.  Blood pressure, pulse rate, and breathing patterns start returning to normal soon after quitting. QUITTING SMOKING   American Lung Association - 1-800-LUNGUSA  American Cancer Society - 1-800-ACS-2345 Document Released: 04/08/2004 Document Revised: 05/24/2011 Document Reviewed: 12/11/2008 [facility] Patient Information 2013 [place].   Stress Management Stress is a state of physical or mental tension that often results from changes in your life or normal routine. Some common causes of stress are:  Death of a loved one.  Injuries or severe illnesses.  Getting fired or changing jobs.  Moving into a new home. Other causes may be:  Sexual problems.  Business or financial losses.  Taking on a large debt.  Regular conflict with someone at home or at work.  Constant tiredness from lack of sleep. It is not just bad things that are stressful. It may be stressful to:  Win the lottery.  Get married.  Buy a new car. The amount of stress that can be easily tolerated varies from person to person. Changes generally cause stress, regardless of the types of change. Too much stress can affect your health. It may lead to physical or emotional problems. Too little stress (boredom) may also become stressful. SUGGESTIONS TO  REDUCE STRESS:  Talk things over with your family and friends. It often is helpful to share your concerns and worries. If you feel your problem is serious, you may want to get help from a professional counselor.  Consider your problems one at a time instead of lumping them all together. Trying to take care of everything at once may seem impossible. List all the things you need to do and then start with the most important one. Set a goal to accomplish 2 or 3 things each day. If you expect to do too many in a single day you will naturally fail, causing you to feel even more stressed.  Do not use alcohol or drugs to relieve stress. Although you may feel better for a short time, they do not remove the problems that caused the stress. They can also be habit forming.  Exercise regularly - at least 3 times per week. Physical exercise can help to relieve that "uptight" feeling and will relax you.  The shortest distance between despair and hope is often a good night's sleep.  Go to bed and get up on time allowing yourself time for appointments without being rushed.  Take a short "time-out" period from any stressful situation that occurs during the day. Close your eyes and take some deep breaths. Starting with the muscles in your face, tense them, hold it for a few seconds, then relax. Repeat this with the muscles in your neck, shoulders, hand, stomach, back and legs.  Take good care of yourself. Eat a balanced diet and get plenty of rest.  Schedule time for having fun. Take a break from your daily routine to relax. HOME CARE INSTRUCTIONS   Call if you feel overwhelmed by your problems and feel you can no longer manage them on your own.  Return immediately if you feel like hurting yourself or someone else. Document Released: 08/25/2000 Document Revised: 05/24/2011 Document Reviewed: 04/17/2007 [facility] Patient Information 2013 [employer].

## 2015-09-23 NOTE — Telephone Encounter (Signed)
Pt was seen for visit today. rx was sent to pharmacy in [place]. Called pt to see if she wanted it sent to mail order instead.

## 2015-09-24 NOTE — Telephone Encounter (Signed)
Left message for call back.

## 2015-09-25 NOTE — Telephone Encounter (Signed)
Pt to check with her mother & will let us know which pharmacy med should be at.

## 2015-09-25 NOTE — Telephone Encounter (Signed)
Left message for call back.

## 2015-09-25 NOTE — Telephone Encounter (Signed)
Patient returning call.

## 2015-09-29 NOTE — Progress Notes (Signed)
Encounter reviewed Jill Jertson, MD   

## 2015-10-01 NOTE — Telephone Encounter (Signed)
No returned call. Encounter closed.

## 2015-11-12 ENCOUNTER — Other Ambulatory Visit: Payer: Self-pay | Admitting: Certified Nurse Midwife

## 2015-11-12 DIAGNOSIS — Z3041 Encounter for surveillance of contraceptive pills: Secondary | ICD-10-CM

## 2015-11-12 NOTE — Telephone Encounter (Signed)
Medication refill request: Caziant  Last AEX:  09-23-15 Next AEX: 09-28-16 Last MMG (if hormonal medication request): N/A Refill authorized: please advise  Sending to PG since DL is out of the office

## 2015-12-23 ENCOUNTER — Other Ambulatory Visit: Payer: Self-pay | Admitting: Nurse Practitioner

## 2015-12-23 NOTE — Telephone Encounter (Signed)
Medication refill request: VELIVET  Last AEX:  09/23/15 DL Next AEX: 4/09/817/17/18  Last MMG (if hormonal medication request): n/a Refill authorized: 11/12/15 #28 w/0 refills; today please advise

## 2016-01-13 ENCOUNTER — Ambulatory Visit: Payer: BLUE CROSS/BLUE SHIELD | Admitting: Neurology

## 2016-02-22 ENCOUNTER — Encounter (HOSPITAL_COMMUNITY): Payer: Self-pay | Admitting: Emergency Medicine

## 2016-02-22 ENCOUNTER — Emergency Department (HOSPITAL_COMMUNITY)
Admission: EM | Admit: 2016-02-22 | Discharge: 2016-02-23 | Disposition: A | Discharge status: Home / Self Care | Payer: BLUE CROSS/BLUE SHIELD | Attending: Emergency Medicine | Admitting: Emergency Medicine

## 2016-02-22 DIAGNOSIS — R410 Disorientation, unspecified: Secondary | ICD-10-CM | POA: Diagnosis not present

## 2016-02-22 DIAGNOSIS — J45909 Unspecified asthma, uncomplicated: Secondary | ICD-10-CM | POA: Diagnosis not present

## 2016-02-22 DIAGNOSIS — F419 Anxiety disorder, unspecified: Secondary | ICD-10-CM | POA: Diagnosis not present

## 2016-02-22 DIAGNOSIS — G219 Secondary parkinsonism, unspecified: Secondary | ICD-10-CM | POA: Insufficient documentation

## 2016-02-22 DIAGNOSIS — R109 Unspecified abdominal pain: Secondary | ICD-10-CM | POA: Insufficient documentation

## 2016-02-22 DIAGNOSIS — Z046 Encounter for general psychiatric examination, requested by authority: Secondary | ICD-10-CM | POA: Diagnosis present

## 2016-02-22 DIAGNOSIS — Z79899 Other long term (current) drug therapy: Secondary | ICD-10-CM | POA: Diagnosis not present

## 2016-02-22 HISTORY — DX: Unspecified psychosis not due to a substance or known physiological condition: F29

## 2016-02-22 LAB — CBC WITH DIFFERENTIAL/PLATELET
BASOS ABS: 0 10*3/uL (ref 0.0–0.1)
BASOS PCT: 0 %
Eosinophils Absolute: 0 10*3/uL (ref 0.0–0.7)
Eosinophils Relative: 0 %
HEMATOCRIT: 42.5 % (ref 36.0–46.0)
HEMOGLOBIN: 14.1 g/dL (ref 12.0–15.0)
Lymphocytes Relative: 16 %
Lymphs Abs: 1.6 10*3/uL (ref 0.7–4.0)
MCH: 29.7 pg (ref 26.0–34.0)
MCHC: 33.2 g/dL (ref 30.0–36.0)
MCV: 89.7 fL (ref 78.0–100.0)
MONOS PCT: 5 %
Monocytes Absolute: 0.5 10*3/uL (ref 0.1–1.0)
NEUTROS ABS: 8.3 10*3/uL — AB (ref 1.7–7.7)
NEUTROS PCT: 79 %
Platelets: 283 10*3/uL (ref 150–400)
RBC: 4.74 MIL/uL (ref 3.87–5.11)
RDW: 13 % (ref 11.5–15.5)
WBC: 10.4 10*3/uL (ref 4.0–10.5)

## 2016-02-22 LAB — PREGNANCY, URINE: Preg Test, Ur: NEGATIVE

## 2016-02-22 LAB — RAPID URINE DRUG SCREEN, HOSP PERFORMED
AMPHETAMINES: NOT DETECTED
BARBITURATES: NOT DETECTED
BENZODIAZEPINES: NOT DETECTED
Cocaine: NOT DETECTED
Opiates: NOT DETECTED
TETRAHYDROCANNABINOL: NOT DETECTED

## 2016-02-22 NOTE — ED Provider Notes (Signed)
[facility]-EM[facility]Y DEPT Provider Note   CSN: [id number] Arrival date & time: 02/22/16  2134  By signing my name below, I, Clarisse GougeXavier Herndon, attest that this documentation has been prepared under the direction and in the presence of Cheron SchaumannLeslie Luellen Howson, [place]PA-C. Electronically Signed: Clarisse GougeXavier Herndon, Scribe. 02/22/16. 11:00 PM.    History   Chief Complaint Chief Complaint  Patient presents with  . Psychiatric Evaluation    per pt she has not been getting along well with her father recently and today she "had to fake passing out to get out of the house" denies any pain or other complaints.    HPI  HPI Comments: Jennifer [name] is a 25 y.o. female brought in by EMS who presents to the Emergency Department c/o moderate to severe stress. Pt notes a Hx of depression and schizophrenia. She states that she had to fake LOC this evening in order to avoid her father. She states that her father's temper has worsened recently, and she and her mother fear that he may harm her, though she notes he has not harmed either of them in the past. The pt reports associated emotional extremes that have subsided, angry outbursts and episodes of confusion. She currently sees a therapist regularly, but she cannot remember her therapist's name. Pt notes that she is not sure what medications she is taking at home as her father normally helps her manage them and he distributes them to her. She is unsure if she has missed any dosages of her medications. The pt is not currently employed, and she denies Hx of bipolar syndrome, ETOH and tobacco use and SI/HI. She also states that she has mild abdominal pain secondary to pain medication she took earlier today.      Past Medical History:  Diagnosis Date  . Asthma   . Disturbance of skin sensation 12/04/2013  . Environmental allergies    seasonal  . Fibromyalgia   . Memory loss   . Migraines    no aura  . Occasional tremors   . Psychosis   . Schizophrenia (HCC)   . 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 Tremor, essential 07/11/2015  . History of attention deficit hyperactivity disorder (ADHD) 07/11/2015  . Paranoid schizophrenia (HCC) 07/10/2015  . Motor tic disorder 01/07/2015  . 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 Fibromyalgia 09/23/2014  . Disturbance of skin sensation 12/04/2013  . Body mass index, pediatric, 85th percentile to less than 95th percentile for age 42/04/2012  . Essential and other specified forms of tremor 12/04/2012  . Myalgia and myositis, unspecified 12/04/2012  . Laxity of ligament 12/04/2012    History reviewed. No pertinent surgical history.  OB History    Gravida Para Term Preterm AB Living   0 0 0 0 0 0   SAB TAB Ectopic Multiple Live Births   0 0 0 0         Home Medications    Prior to Admission medications   Medication Sig Start Date End Date Taking? Authorizing Provider  amantadine (SYMMETREL) 100 MG capsule Take 1 capsule (100 mg total) by mouth 2 (two) times daily. 07/14/15  Yes Oneta Rackanika N Lewis, NP  fexofenadine (ALLEGRA) 180 MG tablet Take 180 mg by mouth daily as needed for allergies.    Yes Historical Provider, MD  FLUoxetine (PROZAC) 20 MG capsule TAKE ONE CAPSULE BY MOUTH ONCE DAILY IN THE MORNING AND [facility]  Yes Historical Provider, MD  LYRICA 100 MG capsule One by mouth 3 times a day Patient taking differently: Take 100 mg by mouth 3 (three) times daily.  01/09/13  Yes Deetta Perla, MD  PROAIR HFA 108 (90 BASE) MCG/ACT inhaler Inhale 1 puff into the lungs every 6 (six) hours as needed for wheezing or shortness of breath.  07/22/14  Yes Historical Provider, MD  propranolol (INDERAL) 20 MG tablet Take 20 mg by mouth every morning.    Yes Historical Provider, MD  QUEtiapine (SEROQUEL) 50 MG tablet Take 1 tablet (50 mg total) by mouth at bedtime. 07/14/15  Yes Oneta Rack, NP  VELIVET 0.1/0.125/0.15 -0.025 MG tablet TAKE 1  TABLET DAILY (NEED VISIT FOR FURTHER REFILLS) Patient taking differently: TAKE 1 TABLET DAILY 12/23/15  Yes Verner Chol, CNM    Family History Family History  Problem Relation Age of Onset  . Breast cancer Mother     BRAC negative  age 26  . Diabetes Father   . Hypertension Father   . Thyroid disease Maternal Aunt   . Breast cancer Maternal Aunt 45    BRAC negative  . Thyroid disease Maternal Grandmother   . Alzheimer's disease Maternal Grandfather   . Lung cancer Paternal Grandmother   . Depression Paternal Grandmother   . Heart attack Paternal Grandfather   . Depression Paternal Grandfather   . Tremor Neg Hx     Social History Social History  Substance Use Topics  . Smoking status: Never Smoker  . Smokeless tobacco: Never Used  . Alcohol use No     Allergies   Ambien [zolpidem tartrate]; Cogentin [benztropine]; and Topamax [topiramate]   Review of Systems Review of Systems  Gastrointestinal: Positive for abdominal pain.  Psychiatric/Behavioral: Positive for behavioral problems, confusion and dysphoric mood. The patient is nervous/anxious.   All other systems reviewed and are negative.    Physical Exam Updated Vital Signs BP 126/80 (BP Location: Right Arm)   Pulse 105   Temp 98.3 F (36.8 C) (Oral)   Resp 20   SpO2 97%   Physical Exam  Constitutional: She is oriented to person, place, and time. She appears well-developed and well-nourished.  HENT:  Head: Normocephalic.  Eyes: EOM are normal.  Neck: Normal range of motion.  Pulmonary/Chest: Effort normal.  Abdominal: She exhibits no distension.  Musculoskeletal: Normal range of motion.  Neurological: She is alert and oriented to person, place, and time.  Nursing note and vitals reviewed.    ED Treatments / Results  DIAGNOSTIC STUDIES: Oxygen Saturation is 97% on RA, adequate by my interpretation.    COORDINATION OF CARE: 11:01 PM Discussed treatment plan with pt at bedside and pt agreed to  plan.  Labs (all labs ordered are listed, but only abnormal results are displayed) Labs Reviewed - No data to display  EKG  EKG Interpretation None       Radiology No results found.  Procedures Procedures (including critical care time)  Medications Ordered in ED Medications - No data to display   Initial Impression / Assessment and Plan / ED Course  I have reviewed the triage vital signs and the nursing notes.  Pertinent labs & imaging results that were available during my care of the patient were reviewed by me and considered in my medical decision making (see chart for details).  Clinical Course     Pt's mother reports she does not feel pt needs to be hospitalized.  Pt is not suicidal or  homicidal.  Mother reports pt is safe at home.  Father was not angry at pt.  Mother reports pt can be seen by her Psychiatrist tomorrow.    Final Clinical Impressions(s) / ED Diagnoses   Final diagnoses:  Anxiety     New Prescriptions New Prescriptions   No medications on file  Mother agrees to have pt return if any further problems or concerns   Elson AreasLeslie K Trew Sunde, PA-C 02/23/16 0043    Samuel JesterKathleen McManus, DO 02/26/16 2059

## 2016-02-22 NOTE — ED Notes (Signed)
Pt requests that father not come back from the lobby.

## 2016-02-22 NOTE — ED Triage Notes (Signed)
Pt presents via EMS. EMS was called for syncope. States upon arrival pt appeared unresponsive but was awakened by ammonia inhalents. Pt states she "had to fake passing out to get out of the house" states she has recently been having difficulty with her father and that is what resulted in her above actions.

## 2016-02-22 NOTE — ED Notes (Signed)
Mother steps to nursing station and speaks with this RN as well as Clydie BraunKaren PA. States the pt was at a birthday celebration today for herself and another family member when her father became upset with another family member. States the pt became upset that her father was upset over the situation and that is when this episode began. Also states that the pt's paranoia typically worsens just before her menstrual cycle and that is due to start this week. Pt does have an appt with her psychologist and therapist on Tuesday of this week as well. Mother does not feel that today's episode is as bad as her previous episode in the spring when she was admitted to behavioral health.

## 2016-02-23 LAB — COMPREHENSIVE METABOLIC PANEL
ALBUMIN: 3.6 g/dL (ref 3.5–5.0)
ALT: 20 U/L (ref 14–54)
AST: 17 U/L (ref 15–41)
Alkaline Phosphatase: 75 U/L (ref 38–126)
Anion gap: 7 (ref 5–15)
BUN: 17 mg/dL (ref 6–20)
CHLORIDE: 105 mmol/L (ref 101–111)
CO2: 23 mmol/L (ref 22–32)
Calcium: 9.2 mg/dL (ref 8.9–10.3)
Creatinine, Ser: 0.61 mg/dL (ref 0.44–1.00)
GFR calc Af Amer: >60 mL/min (ref >60)
GFR calc non Af Amer: >60 mL/min (ref >60)
GLUCOSE: 116 mg/dL — AB (ref 65–99)
POTASSIUM: 3.9 mmol/L (ref 3.5–5.1)
Sodium: 135 mmol/L (ref 135–145)
Total Bilirubin: 0.3 mg/dL (ref 0.3–1.2)
Total Protein: 7.7 g/dL (ref 6.5–8.1)

## 2016-02-23 LAB — ETHANOL: Alcohol, Ethyl (B): <5 mg/dL (ref <5)

## 2016-02-23 NOTE — ED Notes (Signed)
At this time pt states it is ok for her father to come back to the room

## 2016-08-31 IMAGING — DX DG HAND COMPLETE 3+V*L*
3 series · 3 of 3 positions shown · non-contrast
Comparison: None.

CLINICAL DATA: Fall with left hand pain.  Initial encounter.

EXAM:
LEFT HAND - COMPLETE 3+ VIEW

[hand pa]
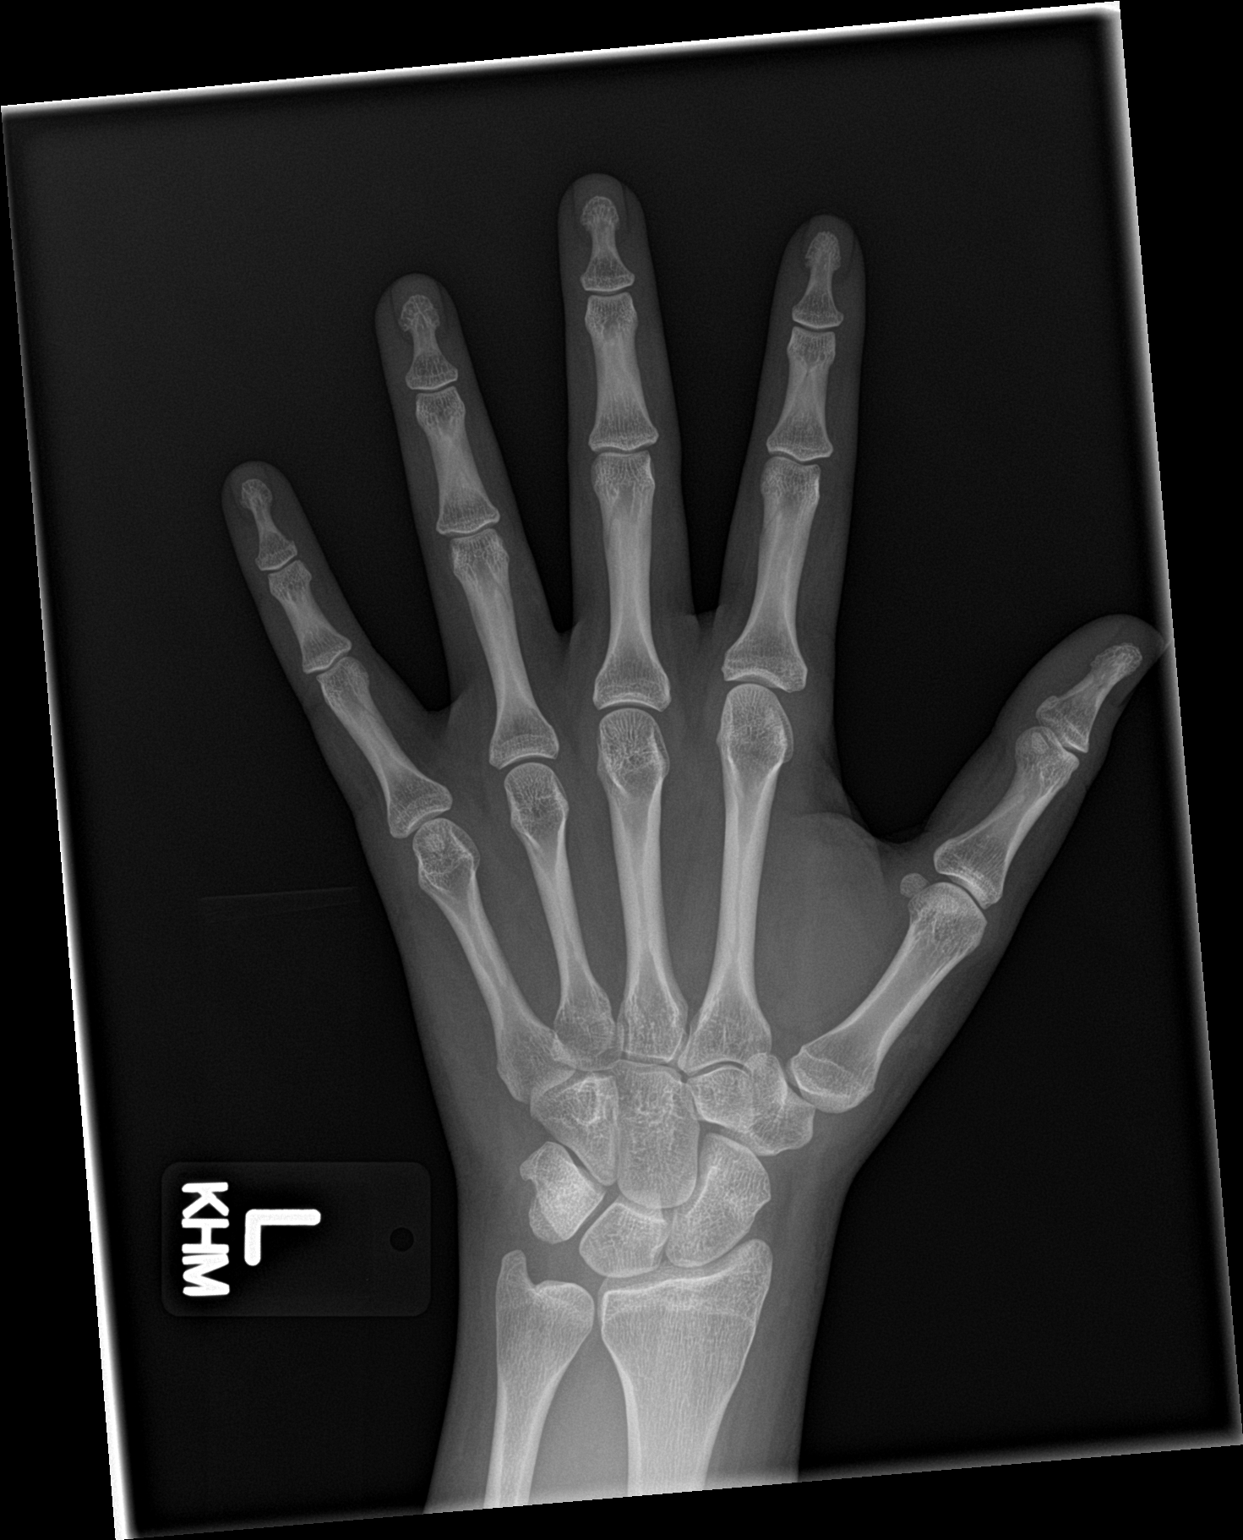

[hand obl]
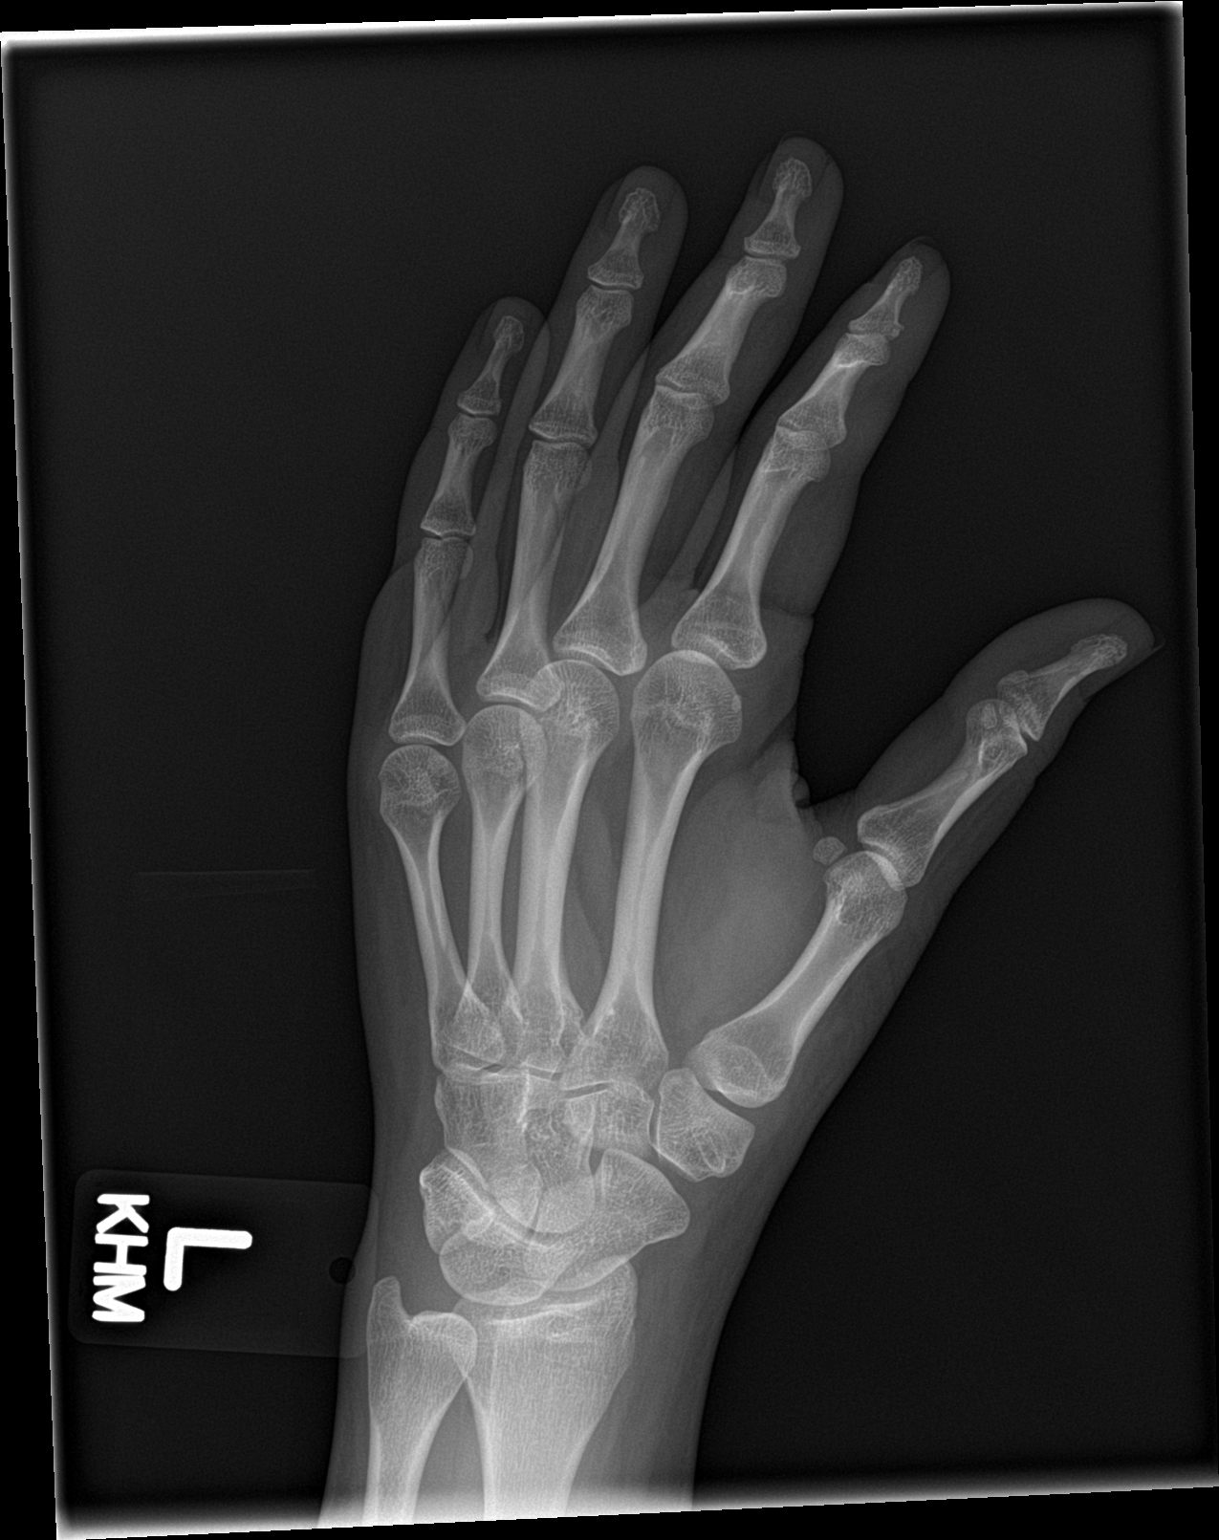

[hand lat]
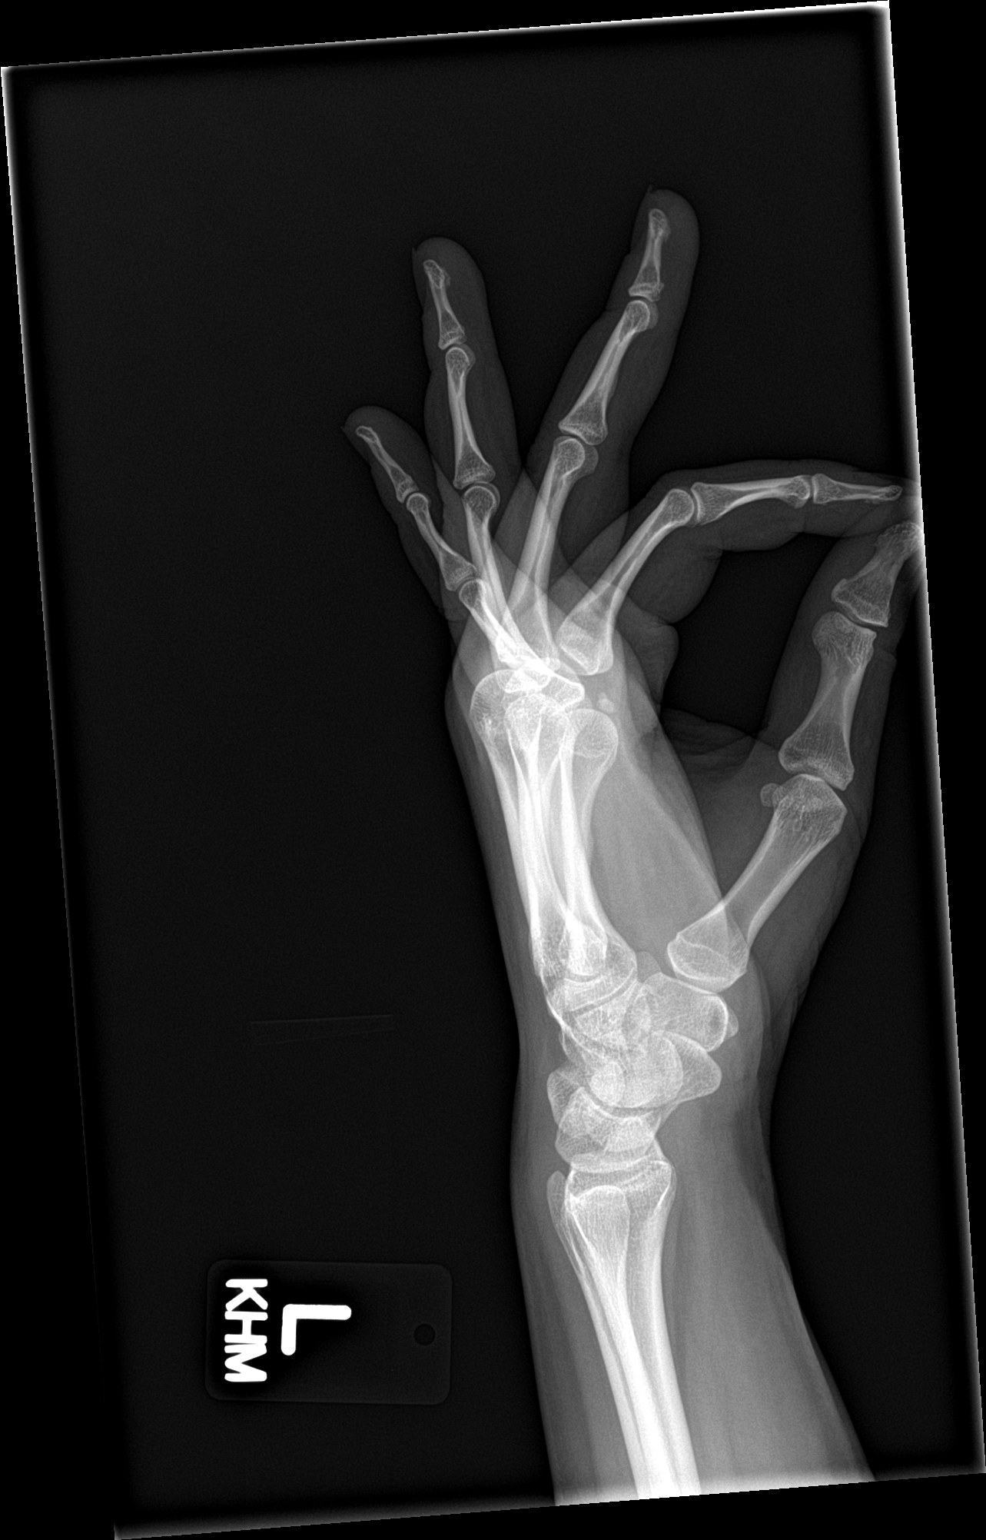

[3 of 3 positions shown; findings below may reference images not displayed]

FINDINGS: There is no evidence of fracture or dislocation. There is no
evidence of arthropathy or other focal bone abnormality. Soft
tissues are unremarkable.
IMPRESSION: Negative.

## 2016-09-28 ENCOUNTER — Encounter: Payer: Self-pay | Admitting: Certified Nurse Midwife

## 2016-09-28 ENCOUNTER — Ambulatory Visit (INDEPENDENT_AMBULATORY_CARE_PROVIDER_SITE_OTHER): Payer: BLUE CROSS/BLUE SHIELD | Admitting: Certified Nurse Midwife

## 2016-09-28 VITALS — BP 110/68 | HR 64 | Resp 16 | Ht 64.5 in | Wt 165.0 lb

## 2016-09-28 DIAGNOSIS — Z01419 Encounter for gynecological examination (general) (routine) without abnormal findings: Secondary | ICD-10-CM | POA: Diagnosis not present

## 2016-09-28 DIAGNOSIS — N946 Dysmenorrhea, unspecified: Secondary | ICD-10-CM

## 2016-09-28 DIAGNOSIS — Z3041 Encounter for surveillance of contraceptive pills: Secondary | ICD-10-CM

## 2016-09-28 DIAGNOSIS — Z23 Encounter for immunization: Secondary | ICD-10-CM | POA: Diagnosis not present

## 2016-09-28 NOTE — Patient Instructions (Signed)
General topics  Next pap or exam is  due in 1 year Take a Women's multivitamin Take 1200 mg. of calcium daily - prefer dietary If any concerns in interim to call back  Breast Self-Awareness Practicing breast self-awareness may pick up problems early, prevent significant medical complications, and possibly save your life. By practicing breast self-awareness, you can become familiar with how your breasts look and feel and if your breasts are changing. This allows you to notice changes early. It can also offer you some reassurance that your breast health is good. One way to learn what is normal for your breasts and whether your breasts are changing is to do a breast self-exam. If you find a lump or something that was not present in the past, it is best to contact your caregiver right away. Other findings that should be evaluated by your caregiver include nipple discharge, especially if it is bloody; skin changes or reddening; areas where the skin seems to be pulled in (retracted); or new lumps and bumps. Breast pain is seldom associated with cancer (malignancy), but should also be evaluated by a caregiver. BREAST SELF-EXAM The best time to examine your breasts is 5 7 days after your menstrual period is over.  ExitCare Patient Information 2013 ExitCare, LLC.   Exercise to Stay Healthy Exercise helps you become and stay healthy. EXERCISE IDEAS AND TIPS Choose exercises that:  You enjoy.  Fit into your day. You do not need to exercise really hard to be healthy. You can do exercises at a slow or medium level and stay healthy. You can:  Stretch before and after working out.  Try yoga, Pilates, or tai chi.  Lift weights.  Walk fast, swim, jog, run, climb stairs, bicycle, dance, or rollerskate.  Take aerobic classes. Exercises that burn about 150 calories:  Running 1  miles in 15 minutes.  Playing volleyball for 45 to 60 minutes.  Washing and waxing a car for 45 to 60  minutes.  Playing touch football for 45 minutes.  Walking 1  miles in 35 minutes.  Pushing a stroller 1  miles in 30 minutes.  Playing basketball for 30 minutes.  Raking leaves for 30 minutes.  Bicycling 5 miles in 30 minutes.  Walking 2 miles in 30 minutes.  Dancing for 30 minutes.  Shoveling snow for 15 minutes.  Swimming laps for 20 minutes.  Walking up stairs for 15 minutes.  Bicycling 4 miles in 15 minutes.  Gardening for 30 to 45 minutes.  Jumping rope for 15 minutes.  Washing windows or floors for 45 to 60 minutes. Document Released: 04/03/2010 Document Revised: 05/24/2011 Document Reviewed: 04/03/2010 ExitCare Patient Information 2013 ExitCare[employer] LLC.   Other topics ( that may be useful information):    Sexually Transmitted Disease Sexually transmitted disease (STD) refers to any infection that is passed from person to person during sexual activity. This may happen by way of saliva, semen, blood, vaginal mucus, or urine. Common STDs include:  Gonorrhea.  Chlamydia.  Syphilis.  HIV/AIDS.  Genital herpes.  Hepatitis B and C.  Trichomonas.  Human papillomavirus (HPV).  Pubic lice. CAUSES  An STD may be spread by bacteria, virus, or parasite. A person can get an STD by:  Sexual intercourse with an infected person.  Sharing sex toys with an infected person.  Sharing needles with an infected person.  Having intimate contact with the genitals, mouth, or rectal areas of an infected person. SYMPTOMS  Some people may not have any symptoms, but   they can still pass the infection to others. Different STDs have different symptoms. Symptoms include:  Painful or bloody urination.  Pain in the pelvis, abdomen, vagina, anus, throat, or eyes.  Skin rash, itching, irritation, growths, or sores (lesions). These usually occur in the genital or anal area.  Abnormal vaginal discharge.  Penile discharge in men.  Soft, flesh-colored skin growths in the  genital or anal area.  Fever.  Pain or bleeding during sexual intercourse.  Swollen glands in the groin area.  Yellow skin and eyes (jaundice). This is seen with hepatitis. DIAGNOSIS  To make a diagnosis, your caregiver may:  Take a medical history.  Perform a physical exam.  Take a specimen (culture) to be examined.  Examine a sample of discharge under a microscope.  Perform blood test TREATMENT   Chlamydia, gonorrhea, trichomonas, and syphilis can be cured with antibiotic medicine.  Genital herpes, hepatitis, and HIV can be treated, but not cured, with prescribed medicines. The medicines will lessen the symptoms.  Genital warts from HPV can be treated with medicine or by freezing, burning (electrocautery), or surgery. Warts may come back.  HPV is a virus and cannot be cured with medicine or surgery.However, abnormal areas may be followed very closely by your caregiver and may be removed from the cervix, vagina, or vulva through office procedures or surgery. If your diagnosis is confirmed, your recent sexual partners need treatment. This is true even if they are symptom-free or have a negative culture or evaluation. They should not have sex until their caregiver says it is okay. HOME CARE INSTRUCTIONS  All sexual partners should be informed, tested, and treated for all STDs.  Take your antibiotics as directed. Finish them even if you start to feel better.  Only take over-the-counter or prescription medicines for pain, discomfort, or fever as directed by your caregiver.  Rest.  Eat a balanced diet and drink enough fluids to keep your urine clear or pale yellow.  Do not have sex until treatment is completed and you have followed up with your caregiver. STDs should be checked after treatment.  Keep all follow-up appointments, Pap tests, and blood tests as directed by your caregiver.  Only use latex condoms and water-soluble lubricants during sexual activity. Do not use  petroleum jelly or oils.  Avoid alcohol and illegal drugs.  Get vaccinated for HPV and hepatitis. If you have not received these vaccines in the past, talk to your caregiver about whether one or both might be right for you.  Avoid risky sex practices that can break the skin. The only way to avoid getting an STD is to avoid all sexual activity.Latex condoms and dental dams (for oral sex) will help lessen the risk of getting an STD, but will not completely eliminate the risk. SEEK MEDICAL CARE IF:   You have a fever.  You have any new or worsening symptoms. Document Released: 05/22/2002 Document Revised: 05/24/2011 Document Reviewed: 05/29/2010 [facility] Patient Information 2013 [place].    Domestic Abuse You are being battered or abused if someone close to you hits, pushes, or physically hurts you in any way. You also are being abused if you are forced into activities. You are being sexually abused if you are forced to have sexual contact of any kind. You are being emotionally abused if you are made to feel worthless or if you are constantly threatened. It is important to remember that help is available. No one has the right to abuse you. PREVENTION OF FURTHER  ABUSE  Learn the warning signs of danger. This varies with situations but may include: the use of alcohol, threats, isolation from friends and family, or forced sexual contact. Leave if you feel that violence is going to occur.  If you are attacked or beaten, report it to the police so the abuse is documented. You do not have to press charges. The police can protect you while you or the attackers are leaving. Get the officer's name and badge number and a copy of the report.  Find someone you can trust and tell them what is happening to you: your caregiver, a nurse, clergy member, close friend or family member. Feeling ashamed is natural, but remember that you have done nothing wrong. No one deserves abuse. Document Released:  02/27/2000 Document Revised: 05/24/2011 Document Reviewed: 05/07/2010 ExitCare Patient Information 2013 ExitCare, LLC.    How Much is Too Much Alcohol? Drinking too much alcohol can cause injury, accidents, and health problems. These types of problems can include:   Car crashes.  Falls.  Family fighting (domestic violence).  Drowning.  Fights.  Injuries.  Burns.  Damage to certain organs.  Having a baby with birth defects. ONE DRINK CAN BE TOO MUCH WHEN YOU ARE:  Working.  Pregnant or breastfeeding.  Taking medicines. Ask your doctor.  Driving or planning to drive. If you or someone you know has a drinking problem, get help from a doctor.  Document Released: 12/26/2008 Document Revised: 05/24/2011 Document Reviewed: 12/26/2008 ExitCare Patient Information 2013 ExitCare, LLC.   Smoking Hazards Smoking cigarettes is extremely bad for your health. Tobacco smoke has over 200 known poisons in it. There are over 60 chemicals in tobacco smoke that cause cancer. Some of the chemicals found in cigarette smoke include:   Cyanide.  Benzene.  Formaldehyde.  Methanol (wood alcohol).  Acetylene (fuel used in welding torches).  Ammonia. Cigarette smoke also contains the poisonous gases nitrogen oxide and carbon monoxide.  Cigarette smokers have an increased risk of many serious medical problems and Smoking causes approximately:  90% of all lung cancer deaths in men.  80% of all lung cancer deaths in women.  90% of deaths from chronic obstructive lung disease. Compared with nonsmokers, smoking increases the risk of:  Coronary heart disease by 2 to 4 times.  Stroke by 2 to 4 times.  Men developing lung cancer by 23 times.  Women developing lung cancer by 13 times.  Dying from chronic obstructive lung diseases by 12 times.  . Smoking is the most preventable cause of death and disease in our society.  WHY IS SMOKING ADDICTIVE?  Nicotine is the chemical  agent in tobacco that is capable of causing addiction or dependence.  When you smoke and inhale, nicotine is absorbed rapidly into the bloodstream through your lungs. Nicotine absorbed through the lungs is capable of creating a powerful addiction. Both inhaled and non-inhaled nicotine may be addictive.  Addiction studies of cigarettes and spit tobacco show that addiction to nicotine occurs mainly during the teen years, when young people begin using tobacco products. WHAT ARE THE BENEFITS OF QUITTING?  There are many health benefits to quitting smoking.   Likelihood of developing cancer and heart disease decreases. Health improvements are seen almost immediately.  Blood pressure, pulse rate, and breathing patterns start returning to normal soon after quitting. QUITTING SMOKING   American Lung Association - 1-800-LUNGUSA  American Cancer Society - 1-800-ACS-2345 Document Released: 04/08/2004 Document Revised: 05/24/2011 Document Reviewed: 12/11/2008 ExitCare Patient Information 2013 ExitCare,   LLC.   Stress Management Stress is a state of physical or mental tension that often results from changes in your life or normal routine. Some common causes of stress are:  Death of a loved one.  Injuries or severe illnesses.  Getting fired or changing jobs.  Moving into a new home. Other causes may be:  Sexual problems.  Business or financial losses.  Taking on a large debt.  Regular conflict with someone at home or at work.  Constant tiredness from lack of sleep. It is not just bad things that are stressful. It may be stressful to:  Win the lottery.  Get married.  Buy a new car. The amount of stress that can be easily tolerated varies from person to person. Changes generally cause stress, regardless of the types of change. Too much stress can affect your health. It may lead to physical or emotional problems. Too little stress (boredom) may also become stressful. SUGGESTIONS TO  REDUCE STRESS:  Talk things over with your family and friends. It often is helpful to share your concerns and worries. If you feel your problem is serious, you may want to get help from a professional counselor.  Consider your problems one at a time instead of lumping them all together. Trying to take care of everything at once may seem impossible. List all the things you need to do and then start with the most important one. Set a goal to accomplish 2 or 3 things each day. If you expect to do too many in a single day you will naturally fail, causing you to feel even more stressed.  Do not use alcohol or drugs to relieve stress. Although you may feel better for a short time, they do not remove the problems that caused the stress. They can also be habit forming.  Exercise regularly - at least 3 times per week. Physical exercise can help to relieve that "uptight" feeling and will relax you.  The shortest distance between despair and hope is often a good night's sleep.  Go to bed and get up on time allowing yourself time for appointments without being rushed.  Take a short "time-out" period from any stressful situation that occurs during the day. Close your eyes and take some deep breaths. Starting with the muscles in your face, tense them, hold it for a few seconds, then relax. Repeat this with the muscles in your neck, shoulders, hand, stomach, back and legs.  Take good care of yourself. Eat a balanced diet and get plenty of rest.  Schedule time for having fun. Take a break from your daily routine to relax. HOME CARE INSTRUCTIONS   Call if you feel overwhelmed by your problems and feel you can no longer manage them on your own.  Return immediately if you feel like hurting yourself or someone else. Document Released: 08/25/2000 Document Revised: 05/24/2011 Document Reviewed: 04/17/2007 ExitCare Patient Information 2013 ExitCare, LLC.   

## 2016-09-28 NOTE — Progress Notes (Signed)
26 y.o. G0P0000 Single  Caucasian Fe here for annual exam. Periods normal, less bleeding or pain with period. Patient is trying to take herself with mother supervision. Would like to continue use for cycle control. Memory is still same with some difficulty. Social stress with pulmonary  Embolus with Dad, has been helping with his care. He is stable now. Mother with patient due to secondary parkinson/ Schizophrenia and speech issues at times.. Continues with same medication and has increased Prozac to twice daily during period time only (seems more irritable) [facility]. Has lost 11 pounds now, with stable medication now. Sees PCP yearly and does labs there. PCP renewed OCP, but prefers our management. No health issues today. Has been a stable year.  Patient's last menstrual period was 09/06/2016 (exact date).          Sexually active: No.  The current method of family planning is OCP (estrogen/progesterone).    Exercising: Yes.    stretching Smoker:  no  Health Maintenance: Pap:  09-20-14 neg History of Abnormal Pap: no MMG:  Left breast u/s 7/16 birads 1:neg Self Breast exams: yes Colonoscopy:  none BMD:   none TDaP:  2008 Shingles: no Pneumonia: no Hep C and HIV: none Labs: none   reports that she has never smoked. She has never used smokeless tobacco. She reports that she does not drink alcohol or use drugs.  Past Medical History:  Diagnosis Date  . Asthma   . Disturbance of skin sensation 12/04/2013  . Environmental allergies    seasonal  . Fibromyalgia   . Memory loss   . Migraines    no aura  . Occasional tremors   . Psychosis   . Schizophrenia (HCC)   . 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 No pertinent surgical history.  Current Outpatient Prescriptions  Medication Sig Dispense Refill  . amantadine (SYMMETREL) 100 MG capsule Take 1 capsule (100 mg total) by mouth 2 (two) times daily. 60 capsule 0  .  fexofenadine (ALLEGRA) 180 MG tablet Take 180 mg by mouth daily as needed for allergies.     Marland Kitchen. FLUoxetine (PROZAC) 20 MG capsule TAKE ONE CAPSULE BY MOUTH ONCE DAILY IN THE MORNING AND TWO CAPSULES AT NOON  2  . LYRICA 100 MG capsule One by mouth 3 times a day (Patient taking differently: Take 100 mg by mouth 3 (three) times daily. ) 93 capsule 5  . PROAIR HFA 108 (90 BASE) MCG/ACT inhaler Inhale 1 puff into the lungs every 6 (six) hours as needed for wheezing or shortness of breath.     . propranolol (INDERAL) 20 MG tablet Take 20 mg by mouth every morning.     Marland Kitchen. QUEtiapine (SEROQUEL) 50 MG tablet Take 1 tablet (50 mg total) by mouth at bedtime. 30 tablet 0  . VELIVET 0.1/0.125/0.15 -0.025 MG tablet TAKE 1 TABLET DAILY (NEED VISIT FOR FURTHER REFILLS) (Patient taking differently: TAKE 1 TABLET DAILY) 84 tablet 2   No current facility-administered medications for this visit.     Family History  Problem Relation Age of Onset  . Breast cancer Mother        BRAC negative  age 24  . Diabetes Father   . Hypertension Father   . Thyroid disease Maternal Aunt   . Breast cancer Maternal Aunt 45       BRAC negative  . Thyroid disease Maternal Grandmother   . Alzheimer's disease Maternal Grandfather   .  Lung cancer Paternal Grandmother   . Depression Paternal Grandmother   . Heart attack Paternal Grandfather   . Depression Paternal Grandfather   . Tremor Neg Hx     ROS:  Pertinent items are noted in HPI.  Otherwise, a comprehensive ROS was negative.  Exam:   BP 110/68   Pulse 64   Resp 16   Ht 5' 4.5" (1.638 m)   Wt 165 lb (74.8 kg)   LMP 09/06/2016 (Exact Date)   BMI 27.88 kg/m  Height: 5' 4.5" (163.8 cm) Ht Readings from Last 3 Encounters:  09/28/16 5' 4.5" (1.638 m)  09/23/15 5' 4.75" (1.645 m)  07/09/15 5\' 7"  (1.702 m)    General appearance: alert, cooperative and appears stated age Head: Normocephalic, without obvious abnormality, atraumatic Neck: no adenopathy, supple,  symmetrical, trachea midline and thyroid normal to inspection and palpation Lungs: clear to auscultation bilaterally Breasts: normal appearance, no masses or tenderness, No nipple retraction or dimpling, No nipple discharge or bleeding, No axillary or supraclavicular adenopathy Heart: regular rate and rhythm Abdomen: soft, non-tender; no masses,  no organomegaly Extremities: extremities normal, atraumatic, no cyanosis or edema Skin: Skin color, texture, turgor normal. No rashes or lesions Lymph nodes: Cervical, supraclavicular, and axillary nodes normal. No abnormal inguinal nodes palpated Neurologic: Grossly normal   Pelvic: External genitalia:  no lesions              Urethra:  normal appearing urethra with no masses, tenderness or lesions              Bartholin's and Skene's: normal                 Vagina: normal appearing vagina with normal color and discharge, no lesions              Cervix: no cervical motion tenderness, no lesions and nulliparous appearance              Pap taken: No. 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 TIcs have decreased  Immunization due.  P:   Reviewed health and wellness pertinent to exam  Desires continued use for dysmenorrhea, has refill.   Continue follow up with MD as indicated  Requests Tdap patient and mother  Pap smear: no   counseled on breast self exam, STD prevention, HIV risk factors and prevention, use and side effects of OCP's, adequate intake of calcium and vitamin D, diet and exercise  return annually or prn  An After Visit Summary was  printed and given to the patient.

## 2017-08-22 ENCOUNTER — Other Ambulatory Visit: Payer: Self-pay | Admitting: Certified Nurse Midwife

## 2017-08-23 NOTE — Telephone Encounter (Signed)
Medication refill request: caziant  Last AEX:  09-28-16 DL  Next AEX: [phone]  Last MMG (if hormonal medication request): n/a Refill authorized: 12-23-15 #[id number], 2RF. Today, please advise.

## 2017-09-22 ENCOUNTER — Other Ambulatory Visit: Payer: Self-pay | Admitting: Certified Nurse Midwife

## 2017-09-23 NOTE — Telephone Encounter (Signed)
Medication refill request: velivet Last AEX:  09-28-16 Next AEX: 09-30-17 Last MMG (if hormonal medication request): u/s 2016 Refill authorized: pt would like to get 1mth to get her to her aex. Please approve if appropriate

## 2017-09-30 ENCOUNTER — Ambulatory Visit: Payer: Managed Care, Other (non HMO) | Admitting: Certified Nurse Midwife

## 2017-09-30 ENCOUNTER — Encounter: Payer: Self-pay | Admitting: Certified Nurse Midwife

## 2017-09-30 ENCOUNTER — Other Ambulatory Visit (HOSPITAL_COMMUNITY)
Admission: RE | Admit: 2017-09-30 | Discharge: 2017-09-30 | Disposition: A | Discharge status: Home / Self Care | Payer: Managed Care, Other (non HMO) | Source: Ambulatory Visit | Attending: Obstetrics & Gynecology | Admitting: Obstetrics & Gynecology

## 2017-09-30 ENCOUNTER — Other Ambulatory Visit: Payer: Self-pay

## 2017-09-30 VITALS — BP 102/62 | HR 60 | Resp 16 | Ht 64.0 in | Wt 169.0 lb

## 2017-09-30 DIAGNOSIS — Z124 Encounter for screening for malignant neoplasm of cervix: Secondary | ICD-10-CM | POA: Insufficient documentation

## 2017-09-30 DIAGNOSIS — Z01419 Encounter for gynecological examination (general) (routine) without abnormal findings: Secondary | ICD-10-CM | POA: Diagnosis not present

## 2017-09-30 DIAGNOSIS — Z3041 Encounter for surveillance of contraceptive pills: Secondary | ICD-10-CM

## 2017-09-30 MED ORDER — DESOGEST-ETH ESTRAD TRIPHASIC 0.1/0.125/0.15 -0.025 MG PO TABS: 1.0000 | ORAL_TABLET | Freq: Every day | ORAL | 4 refills | Status: DC

## 2017-09-30 NOTE — Patient Instructions (Signed)
General topics  Next pap or exam is  due in 1 year Take a Women's multivitamin Take 1200 mg. of calcium daily - prefer dietary If any concerns in interim to call back  Breast Self-Awareness Practicing breast self-awareness may pick up problems early, prevent significant medical complications, and possibly save your life. By practicing breast self-awareness, you can become familiar with how your breasts look and feel and if your breasts are changing. This allows you to notice changes early. It can also offer you some reassurance that your breast health is good. One way to learn what is normal for your breasts and whether your breasts are changing is to do a breast self-exam. If you find a lump or something that was not present in the past, it is best to contact your caregiver right away. Other findings that should be evaluated by your caregiver include nipple discharge, especially if it is bloody; skin changes or reddening; areas where the skin seems to be pulled in (retracted); or new lumps and bumps. Breast pain is seldom associated with cancer (malignancy), but should also be evaluated by a caregiver. BREAST SELF-EXAM The best time to examine your breasts is 5 7 days after your menstrual period is over.  ExitCare Patient Information 2013 ExitCare, LLC.   Exercise to Stay Healthy Exercise helps you become and stay healthy. EXERCISE IDEAS AND TIPS Choose exercises that:  You enjoy.  Fit into your day. You do not need to exercise really hard to be healthy. You can do exercises at a slow or medium level and stay healthy. You can:  Stretch before and after working out.  Try yoga, Pilates, or tai chi.  Lift weights.  Walk fast, swim, jog, run, climb stairs, bicycle, dance, or rollerskate.  Take aerobic classes. Exercises that burn about 150 calories:  Running 1  miles in 15 minutes.  Playing volleyball for 45 to 60 minutes.  Washing and waxing a car for 45 to 60  minutes.  Playing touch football for 45 minutes.  Walking 1  miles in 35 minutes.  Pushing a stroller 1  miles in 30 minutes.  Playing basketball for 30 minutes.  Raking leaves for 30 minutes.  Bicycling 5 miles in 30 minutes.  Walking 2 miles in 30 minutes.  Dancing for 30 minutes.  Shoveling snow for 15 minutes.  Swimming laps for 20 minutes.  Walking up stairs for 15 minutes.  Bicycling 4 miles in 15 minutes.  Gardening for 30 to 45 minutes.  Jumping rope for 15 minutes.  Washing windows or floors for 45 to 60 minutes. Document Released: 04/03/2010 Document Revised: 05/24/2011 Document Reviewed: 04/03/2010 ExitCare Patient Information 2013 ExitCare[employer] LLC.   Other topics ( that may be useful information):    Sexually Transmitted Disease Sexually transmitted disease (STD) refers to any infection that is passed from person to person during sexual activity. This may happen by way of saliva, semen, blood, vaginal mucus, or urine. Common STDs include:  Gonorrhea.  Chlamydia.  Syphilis.  HIV/AIDS.  Genital herpes.  Hepatitis B and C.  Trichomonas.  Human papillomavirus (HPV).  Pubic lice. CAUSES  An STD may be spread by bacteria, virus, or parasite. A person can get an STD by:  Sexual intercourse with an infected person.  Sharing sex toys with an infected person.  Sharing needles with an infected person.  Having intimate contact with the genitals, mouth, or rectal areas of an infected person. SYMPTOMS  Some people may not have any symptoms, but   they can still pass the infection to others. Different STDs have different symptoms. Symptoms include:  Painful or bloody urination.  Pain in the pelvis, abdomen, vagina, anus, throat, or eyes.  Skin rash, itching, irritation, growths, or sores (lesions). These usually occur in the genital or anal area.  Abnormal vaginal discharge.  Penile discharge in men.  Soft, flesh-colored skin growths in the  genital or anal area.  Fever.  Pain or bleeding during sexual intercourse.  Swollen glands in the groin area.  Yellow skin and eyes (jaundice). This is seen with hepatitis. DIAGNOSIS  To make a diagnosis, your caregiver may:  Take a medical history.  Perform a physical exam.  Take a specimen (culture) to be examined.  Examine a sample of discharge under a microscope.  Perform blood test TREATMENT   Chlamydia, gonorrhea, trichomonas, and syphilis can be cured with antibiotic medicine.  Genital herpes, hepatitis, and HIV can be treated, but not cured, with prescribed medicines. The medicines will lessen the symptoms.  Genital warts from HPV can be treated with medicine or by freezing, burning (electrocautery), or surgery. Warts may come back.  HPV is a virus and cannot be cured with medicine or surgery.However, abnormal areas may be followed very closely by your caregiver and may be removed from the cervix, vagina, or vulva through office procedures or surgery. If your diagnosis is confirmed, your recent sexual partners need treatment. This is true even if they are symptom-free or have a negative culture or evaluation. They should not have sex until their caregiver says it is okay. HOME CARE INSTRUCTIONS  All sexual partners should be informed, tested, and treated for all STDs.  Take your antibiotics as directed. Finish them even if you start to feel better.  Only take over-the-counter or prescription medicines for pain, discomfort, or fever as directed by your caregiver.  Rest.  Eat a balanced diet and drink enough fluids to keep your urine clear or pale yellow.  Do not have sex until treatment is completed and you have followed up with your caregiver. STDs should be checked after treatment.  Keep all follow-up appointments, Pap tests, and blood tests as directed by your caregiver.  Only use latex condoms and water-soluble lubricants during sexual activity. Do not use  petroleum jelly or oils.  Avoid alcohol and illegal drugs.  Get vaccinated for HPV and hepatitis. If you have not received these vaccines in the past, talk to your caregiver about whether one or both might be right for you.  Avoid risky sex practices that can break the skin. The only way to avoid getting an STD is to avoid all sexual activity.Latex condoms and dental dams (for oral sex) will help lessen the risk of getting an STD, but will not completely eliminate the risk. SEEK MEDICAL CARE IF:   You have a fever.  You have any new or worsening symptoms. Document Released: 05/22/2002 Document Revised: 05/24/2011 Document Reviewed: 05/29/2010 [facility] Patient Information 2013 [place].    Domestic Abuse You are being battered or abused if someone close to you hits, pushes, or physically hurts you in any way. You also are being abused if you are forced into activities. You are being sexually abused if you are forced to have sexual contact of any kind. You are being emotionally abused if you are made to feel worthless or if you are constantly threatened. It is important to remember that help is available. No one has the right to abuse you. PREVENTION OF FURTHER  ABUSE  Learn the warning signs of danger. This varies with situations but may include: the use of alcohol, threats, isolation from friends and family, or forced sexual contact. Leave if you feel that violence is going to occur.  If you are attacked or beaten, report it to the police so the abuse is documented. You do not have to press charges. The police can protect you while you or the attackers are leaving. Get the officer's name and badge number and a copy of the report.  Find someone you can trust and tell them what is happening to you: your caregiver, a nurse, clergy member, close friend or family member. Feeling ashamed is natural, but remember that you have done nothing wrong. No one deserves abuse. Document Released:  02/27/2000 Document Revised: 05/24/2011 Document Reviewed: 05/07/2010 ExitCare Patient Information 2013 ExitCare, LLC.    How Much is Too Much Alcohol? Drinking too much alcohol can cause injury, accidents, and health problems. These types of problems can include:   Car crashes.  Falls.  Family fighting (domestic violence).  Drowning.  Fights.  Injuries.  Burns.  Damage to certain organs.  Having a baby with birth defects. ONE DRINK CAN BE TOO MUCH WHEN YOU ARE:  Working.  Pregnant or breastfeeding.  Taking medicines. Ask your doctor.  Driving or planning to drive. If you or someone you know has a drinking problem, get help from a doctor.  Document Released: 12/26/2008 Document Revised: 05/24/2011 Document Reviewed: 12/26/2008 ExitCare Patient Information 2013 ExitCare, LLC.   Smoking Hazards Smoking cigarettes is extremely bad for your health. Tobacco smoke has over 200 known poisons in it. There are over 60 chemicals in tobacco smoke that cause cancer. Some of the chemicals found in cigarette smoke include:   Cyanide.  Benzene.  Formaldehyde.  Methanol (wood alcohol).  Acetylene (fuel used in welding torches).  Ammonia. Cigarette smoke also contains the poisonous gases nitrogen oxide and carbon monoxide.  Cigarette smokers have an increased risk of many serious medical problems and Smoking causes approximately:  90% of all lung cancer deaths in men.  80% of all lung cancer deaths in women.  90% of deaths from chronic obstructive lung disease. Compared with nonsmokers, smoking increases the risk of:  Coronary heart disease by 2 to 4 times.  Stroke by 2 to 4 times.  Men developing lung cancer by 23 times.  Women developing lung cancer by 13 times.  Dying from chronic obstructive lung diseases by 12 times.  . Smoking is the most preventable cause of death and disease in our society.  WHY IS SMOKING ADDICTIVE?  Nicotine is the chemical  agent in tobacco that is capable of causing addiction or dependence.  When you smoke and inhale, nicotine is absorbed rapidly into the bloodstream through your lungs. Nicotine absorbed through the lungs is capable of creating a powerful addiction. Both inhaled and non-inhaled nicotine may be addictive.  Addiction studies of cigarettes and spit tobacco show that addiction to nicotine occurs mainly during the teen years, when young people begin using tobacco products. WHAT ARE THE BENEFITS OF QUITTING?  There are many health benefits to quitting smoking.   Likelihood of developing cancer and heart disease decreases. Health improvements are seen almost immediately.  Blood pressure, pulse rate, and breathing patterns start returning to normal soon after quitting. QUITTING SMOKING   American Lung Association - 1-800-LUNGUSA  American Cancer Society - 1-800-ACS-2345 Document Released: 04/08/2004 Document Revised: 05/24/2011 Document Reviewed: 12/11/2008 ExitCare Patient Information 2013 ExitCare,   LLC.   Stress Management Stress is a state of physical or mental tension that often results from changes in your life or normal routine. Some common causes of stress are:  Death of a loved one.  Injuries or severe illnesses.  Getting fired or changing jobs.  Moving into a new home. Other causes may be:  Sexual problems.  Business or financial losses.  Taking on a large debt.  Regular conflict with someone at home or at work.  Constant tiredness from lack of sleep. It is not just bad things that are stressful. It may be stressful to:  Win the lottery.  Get married.  Buy a new car. The amount of stress that can be easily tolerated varies from person to person. Changes generally cause stress, regardless of the types of change. Too much stress can affect your health. It may lead to physical or emotional problems. Too little stress (boredom) may also become stressful. SUGGESTIONS TO  REDUCE STRESS:  Talk things over with your family and friends. It often is helpful to share your concerns and worries. If you feel your problem is serious, you may want to get help from a professional counselor.  Consider your problems one at a time instead of lumping them all together. Trying to take care of everything at once may seem impossible. List all the things you need to do and then start with the most important one. Set a goal to accomplish 2 or 3 things each day. If you expect to do too many in a single day you will naturally fail, causing you to feel even more stressed.  Do not use alcohol or drugs to relieve stress. Although you may feel better for a short time, they do not remove the problems that caused the stress. They can also be habit forming.  Exercise regularly - at least 3 times per week. Physical exercise can help to relieve that "uptight" feeling and will relax you.  The shortest distance between despair and hope is often a good night's sleep.  Go to bed and get up on time allowing yourself time for appointments without being rushed.  Take a short "time-out" period from any stressful situation that occurs during the day. Close your eyes and take some deep breaths. Starting with the muscles in your face, tense them, hold it for a few seconds, then relax. Repeat this with the muscles in your neck, shoulders, hand, stomach, back and legs.  Take good care of yourself. Eat a balanced diet and get plenty of rest.  Schedule time for having fun. Take a break from your daily routine to relax. HOME CARE INSTRUCTIONS   Call if you feel overwhelmed by your problems and feel you can no longer manage them on your own.  Return immediately if you feel like hurting yourself or someone else. Document Released: 08/25/2000 Document Revised: 05/24/2011 Document Reviewed: 04/17/2007 ExitCare Patient Information 2013 ExitCare, LLC.   

## 2017-09-30 NOTE — Progress Notes (Signed)
27 y.o. G0P0000 Single  Caucasian Fe here for annual exam. Mother with patient today and has Guardian copy for care of daughter. Patient tremors no change, feels emotions are changing. Seeing Dr.Hall for PCP for aex, labs and inhaler medication. Sees [facility] for medication management and counseling. Having more problems with processing negative emotions and trying to work how to do this. Periods normal, no issues. OCP working well for cycle and hormonal control. No migraine headache change, actually less headaches, no aura ever. No other health issues today.   No LMP recorded.          Sexually active: No.  The current method of family planning is OCP (estrogen/progesterone).    Exercising: Yes.    yoga Smoker:  no  Health Maintenance: Pap:  09-20-14 neg History of Abnormal Pap: no MMG:  Left breast u/s 7/16 birads 1:neg Self Breast exams: yes Colonoscopy:  none BMD:   none TDaP:  2018 Shingles: no Pneumonia: no Hep C and HIV: not done Labs: no   reports that she has never smoked. She has never used smokeless tobacco. She reports that she does not drink alcohol or use drugs.  Past Medical History:  Diagnosis Date  . Asthma   . Disturbance of skin sensation 12/04/2013  . Environmental allergies    seasonal  . Fibromyalgia   . Memory loss   . Migraines    no aura  . Occasional tremors   . Psychosis   . Schizophrenia (HCC)   . 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  Current Outpatient Medications  Medication Sig Dispense Refill  . amantadine (SYMMETREL) 100 MG capsule Take 1 capsule (100 mg total) by mouth 2 (two) times daily. 60 capsule 0  . fexofenadine (ALLEGRA) 180 MG tablet Take 180 mg by mouth daily as needed for allergies.     Marland Kitchen FLUoxetine (PROZAC) 20 MG capsule TAKE ONE CAPSULE BY MOUTH ONCE DAILY IN THE MORNING AND TWO CAPSULES AT NOON  2  . LYRICA 100 MG capsule One by mouth 3 times a day (Patient taking  differently: Take 100 mg by mouth 3 (three) times daily. ) 93 capsule 5  . PROAIR HFA 108 (90 BASE) MCG/ACT inhaler Inhale 1 puff into the lungs every 6 (six) hours as needed for wheezing or shortness of breath.     . propranolol (INDERAL) 20 MG tablet Take 20 mg by mouth every morning.     Marland Kitchen QUEtiapine (SEROQUEL) 50 MG tablet Take 1 tablet (50 mg total) by mouth at bedtime. 30 tablet 0  . VELIVET 0.1/0.125/0.15 -0.025 MG tablet TAKE 1 TABLET BY MOUTH DAILY 28 tablet 0   No current facility-administered medications for this visit.     Family History  Problem Relation Age of Onset  . Breast cancer Mother        BRAC negative  age 81  . Diabetes Father   . Hypertension Father   . Pulmonary embolism Father   . Thyroid disease Maternal Aunt   . Breast cancer Maternal Aunt 45       BRAC negative  . Thyroid disease Maternal Grandmother   . Alzheimer's disease Maternal Grandfather   . Lung cancer Paternal Grandmother   . Depression Paternal Grandmother   . Heart attack Paternal Grandfather   . Depression Paternal Grandfather     ROS:  Pertinent items are noted in HPI.  Otherwise, a comprehensive ROS was negative.  Exam:  There were no vitals taken for this visit.   Ht Readings from Last 3 Encounters:  09/28/16 5' 4.5" (1.638 m)  09/23/15 5' 4.75" (1.645 m)  07/09/15 5\' 7"  (1.702 m)    General appearance: alert, cooperative and appears stated age Head: Normocephalic, without obvious abnormality, atraumatic Neck: no adenopathy, supple, symmetrical, trachea midline and thyroid normal to inspection and palpation Lungs: clear to auscultation bilaterally Breasts: normal appearance, no masses or tenderness, No nipple retraction or dimpling, No nipple discharge or bleeding, No axillary or supraclavicular adenopathy, no redness under breast on chest wall noted today Heart: regular rate and rhythm Abdomen: soft, non-tender; no masses,  no organomegaly Extremities: extremities normal,  atraumatic, no cyanosis or edema Skin: Skin color, texture, turgor normal. No rashes or lesions Lymph nodes: Cervical, supraclavicular, and axillary nodes normal. No abnormal inguinal nodes palpated Neurologic: Grossly normal   Pelvic: External genitalia:  no lesions, normal female              Urethra:  normal appearing urethra with no masses, tenderness or lesions              Bartholin's and Skene's: normal                 Vagina: normal appearing vagina with normal color and discharge, no lesions              Cervix: no cervical motion tenderness, no lesions and nulliparous appearance              Pap taken: Yes.   Bimanual Exam:  Uterus:  normal size, contour, position, consistency, mobility, non-tender and anteverted              Adnexa: normal adnexa and no mass, fullness, tenderness               Rectovaginal: Confirms               Anus:  normal appearance  Chaperone present: yes  A:  Well Woman with normal exam  Contraception OCP desired  Negative emotional occurrences now a challenge.  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

## 2017-10-03 LAB — CYTOLOGY - PAP: Diagnosis: NEGATIVE

## 2017-10-22 ENCOUNTER — Other Ambulatory Visit: Payer: Self-pay | Admitting: Certified Nurse Midwife

## 2018-01-31 ENCOUNTER — Ambulatory Visit: Payer: Managed Care, Other (non HMO) | Admitting: Certified Nurse Midwife

## 2018-01-31 ENCOUNTER — Encounter: Payer: Self-pay | Admitting: Certified Nurse Midwife

## 2018-01-31 ENCOUNTER — Other Ambulatory Visit: Payer: Self-pay

## 2018-01-31 VITALS — BP 100/62 | HR 68 | Resp 16 | Wt 175.0 lb

## 2018-01-31 DIAGNOSIS — S30824A Blister (nonthermal) of vagina and vulva, initial encounter: Secondary | ICD-10-CM

## 2018-01-31 DIAGNOSIS — G43101 Migraine with aura, not intractable, with status migrainosus: Secondary | ICD-10-CM

## 2018-01-31 DIAGNOSIS — N946 Dysmenorrhea, unspecified: Secondary | ICD-10-CM | POA: Diagnosis not present

## 2018-01-31 DIAGNOSIS — N949 Unspecified condition associated with female genital organs and menstrual cycle: Secondary | ICD-10-CM | POA: Diagnosis not present

## 2018-01-31 MED ORDER — NORETHINDRONE 0.35 MG PO TABS: 1.0000 | ORAL_TABLET | Freq: Every day | ORAL | 1 refills | Status: DC

## 2018-01-31 NOTE — Progress Notes (Signed)
27 y.o. Single Caucasian female [id number] here with complaint of vaginal symptoms of itching and scratch area inside of lip area and vagina.  Patient has not used anything to clean with that would have caused this. She has noted some bumps in this area also. Mother with patient due to being her guardian. 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 She has not used any new personal products or changed products that they are aware of.  Describes discharge as white, no odor.. Onset of symptoms 4-5  days ago. No STD concerns, not ever sexually active.. Urinary symptoms none . Contraception is OCP for cycle control only. No other problems today.  Review of Systems  Constitutional: Negative.   HENT: Negative.   Eyes: Negative.   Respiratory: Negative.   Cardiovascular: Negative.   Gastrointestinal: Negative.   Genitourinary: Negative for dysuria, frequency and urgency.       Scratch pain in lip area of vagina, no bleeding  Musculoskeletal: Negative.   Skin: Negative.   Neurological: Positive for tremors and headaches.       Recent diagnosis of migraine aura  Endo/Heme/Allergies: Negative.   Psychiatric/Behavioral: Positive for depression and memory loss. The patient is nervous/anxious.     O:Healthy female WDWN Affect: normal, orientation x 3  Physical Exam  Constitutional: She is oriented to person, place, and time. She appears well-developed and well-nourished.  Cardiovascular: Normal rate.  Abdominal: Soft. She exhibits no distension and no mass. There is no tenderness.  Genitourinary: Vagina normal and uterus normal.    Pelvic exam was performed with patient supine. There is no rash, tenderness, lesion or injury on the right labia. There is tenderness on the left labia. There is no rash, lesion or injury on the left labia. Uterus is not tender. Cervix exhibits no motion tenderness, no discharge and no friability. Right adnexum displays no mass, no tenderness and no fullness. Left  adnexum displays no mass, no tenderness and no fullness.  Genitourinary Comments: Scant amount of vaginal discharge. Affirm taken  Lymphadenopathy: No inguinal adenopathy noted on the right or left side.       Right: No inguinal adenopathy present.       Left: No inguinal adenopathy present.  Neurological: She is alert and oriented to person, place, and time.  Has headache today, last two headaches she had sparkling flashing lights before headache started  Skin: Skin is warm and dry.  Psychiatric: She has a normal mood and affect. Her speech is normal and behavior is normal. Judgment and thought content normal. Cognition and memory are normal.  Crying because vaginal area hurting     A:Normal pelvic exam Dysmenorrhea with OCP use ? Herpetic healing blisters noted R/O vaginal infection Migraine headache change now with aura Mother guardian of patient here with her due to Parkinson's tremor and memory loss   P:Discussed findings of normal pelvic exam with ? Healing herpetic blisters noted. Culture was taken and will advise once labs in if concerns. Discussed yeast vaginitis can cause excoriation with excessive itching and scratching of area. Patient agrees that she has scratched area "a lot". . Discussed Aveeno or baking soda sitz bath for comfort, at home, while waiting on lab results. Can start using 1 % OTC hydrocortisone to area for comfort twice daily. Lab: Affirm, HSV culture  Avoid moist clothes for extended period of time. Encouraged to sleep in loose underwear for comfort.  Discussed due to migraine status change with aura, she needs to change to POP  use. Estrogen can increase risk with headache of stroke. Patient and mother agreeable to change to POP. Expectations of bleeding profile discussed. Rx Camilla see order with instructions. Will stop current pack and start POP with period. Questions addressed.  Rv prn as above

## 2018-01-31 NOTE — Patient Instructions (Signed)
Aveeno oatmeal sitz bath Genital Herpes Genital herpes is a common sexually transmitted infection (STI) that is caused by a virus. The virus spreads from person to person through sexual contact. Infection can cause itching, blisters, and sores around the genitals or rectum. Symptoms may last several days and then go away This is called an outbreak. However, the virus remains in your body, so you may have more outbreaks in the future. The time between outbreaks varies and can be months or years. Genital herpes affects men and women. It is particularly concerning for pregnant women because the virus can be passed to the baby during delivery and can cause serious problems. Genital herpes is also a concern for people who have a weak disease-fighting (immune) system. What are the causes? This condition is caused by the herpes simplex virus (HSV) type 1 or type 2. The virus may spread through:  Sexual contact with an infected person, including vaginal, anal, and oral sex.  Contact with fluid from a herpes sore.  The skin. This means that you can get herpes from an infected partner even if he or she does not have a visible sore or does not know that he or she is infected.  What increases the risk? You are more likely to develop this condition if:  You have sex with many partners.  You do not use latex condoms during sex.  What are the signs or symptoms? Most people do not have symptoms (asymptomatic) or have mild symptoms that may be mistaken for other skin problems. Symptoms may include:  Small red bumps near the genitals, rectum, or mouth. These bumps turn into blisters and then turn into sores.  Flu-like symptoms, including: ? Fever. ? Body aches. ? Swollen lymph nodes. ? Headache.  Painful urination.  Pain and itching in the genital area or rectal area.  Vaginal discharge.  Tingling or shooting pain in the legs and buttocks.  Generally, symptoms are more severe and last longer  during the first (primary) outbreak. Flu-like symptoms are also more common during the primary outbreak. How is this diagnosed? Genital herpes may be diagnosed based on:  A physical exam.  Your medical history.  Blood tests.  A test of a fluid sample (culture) from an open sore.  How is this treated? There is no cure for this condition, but treatment with antiviral medicines that are taken by mouth (orally) can do the following:  Speed up healing and relieve symptoms.  Help to reduce the spread of the virus to sexual partners.  Limit the chance of future outbreaks, or make future outbreaks shorter.  Lessen symptoms of future outbreaks.  Your health care provider may also recommend pain relief medicines, such as aspirin or ibuprofen. Follow these instructions at home: Sexual activity  Do not have sexual contact during active outbreaks.  Practice safe sex. Latex condoms and female condoms may help prevent the spread of the herpes virus. General instructions  Keep the affected areas dry and clean.  Take over-the-counter and prescription medicines only as told by your health care provider.  Avoid rubbing or touching blisters and sores. If you do touch blisters or sores: ? Wash your hands thoroughly with soap and water. ? Do not touch your eyes afterward.  To help relieve pain or itching, you may take the following actions as directed by your health care provider: ? Apply a cold, wet cloth (cold compress) to affected areas 4-6 times a day. ? Apply a substance that protects your skin  and reduces bleeding (astringent). ? Apply a gel that helps relieve pain around sores (lidocaine gel). ? Take a warm, shallow bath that cleans the genital area (sitz bath).  Keep all follow-up visits as told by your health care provider. This is important. How is this prevented?  Use condoms. Although anyone can get genital herpes during sexual contact, even with the use of a condom, a condom  can provide some protection.  Avoid having multiple sexual partners.  Talk with your sexual partner about any symptoms either of you may have. Also, talk with your partner about any history of STIs.  Get tested for STIs before you have sex. Ask your partner to do the same.  Do not have sexual contact if you have symptoms of genital herpes. Contact a health care provider if:  Your symptoms are not improving with medicine.  Your symptoms return.  You have new symptoms.  You have a fever.  You have abdominal pain.  You have redness, swelling, or pain in your eye.  You notice new sores on other parts of your body.  You are a woman and experience bleeding between menstrual periods.  You have had herpes and you become pregnant or plan to become pregnant. Summary  Genital herpes is a common sexually transmitted infection (STI) that is caused by the herpes simplex virus (HSV) type 1 or type 2.  These viruses are most often spread through sexual contact with an infected person.  You are more likely to develop this condition if you have sex with many partners or you have unprotected sex.  Most people do not have symptoms (asymptomatic) or have mild symptoms that may be mistaken for other skin problems. Symptoms occur as outbreaks that may happen months or years apart.  There is no cure for this condition, but treatment with oral antiviral medicines can reduce symptoms, reduce the chance of spreading the virus to a partner, prevent future outbreaks, or shorten future outbreaks. This information is not intended to replace advice given to you by your health care provider. Make sure you discuss any questions you have with your health care provider. Document Released: 02/27/2000 Document Revised: 01/30/2016 Document Reviewed: 01/30/2016 Elsevier Interactive Patient Education  [employer].

## 2018-02-02 LAB — SPECIMEN STATUS REPORT

## 2018-02-03 ENCOUNTER — Other Ambulatory Visit: Payer: Self-pay

## 2018-02-03 LAB — VAGINITIS/VAGINOSIS, DNA PROBE
Candida Species: POSITIVE — AB
Gardnerella vaginalis: NEGATIVE
Trichomonas vaginosis: NEGATIVE

## 2018-02-03 LAB — HERPES SIMPLEX VIRUS CULTURE

## 2018-02-03 MED ORDER — FLUCONAZOLE 150 MG PO TABS: ORAL_TABLET | ORAL | 0 refills | Status: DC

## 2018-02-03 NOTE — Telephone Encounter (Signed)
Patients mother notified of results. DPR signed. Rx for diflucan sent to pharmacy.

## 2018-02-03 NOTE — Telephone Encounter (Signed)
[facility]'s [facility]'s results.

## 2018-02-03 NOTE — Telephone Encounter (Signed)
Called patient to give her the positive yeast results. Left message for callback. See lab.

## 2018-02-06 ENCOUNTER — Telehealth: Payer: Self-pay

## 2018-02-06 NOTE — Telephone Encounter (Signed)
-----   Message from Verner Choleborah S Leonard, CNM sent at 02/05/2018  4:42 PM EST ----- Notify patient her herpes culture was negative Patient status

## 2018-02-06 NOTE — Telephone Encounter (Signed)
Left message for call back.

## 2018-02-07 NOTE — Telephone Encounter (Signed)
Patient notified of results. See lab 

## 2018-03-22 MED FILL — PROPRANOLOL 20 MG TABLET: 20 | 90 days supply | Qty: 180 | Fill #0

## 2018-03-22 MED FILL — PREGABALIN 100 MG CAPS: 100 | 90 days supply | Qty: 270 | Fill #0

## 2018-03-22 MED FILL — QUETIAPINE FUMARATE 50 MG T: 50 | 90 days supply | Qty: 180 | Fill #0

## 2018-03-22 MED FILL — FLUoxetine HCL 20 MG CAPS: 20 | 90 days supply | Qty: 270 | Fill #0

## 2018-03-22 MED FILL — AMANTADINE HCL 100 MG TAB: 100 | 90 days supply | Qty: 180 | Fill #0

## 2018-04-05 DIAGNOSIS — Z0289 Encounter for other administrative examinations: Secondary | ICD-10-CM

## 2018-04-13 MED FILL — NORLYDA 0.35 MG TABS: 0.35 | 84 days supply | Qty: 84 | Fill #0

## 2018-05-11 MED FILL — PROPRANOLOL 40 MG TABLET: 40 | 90 days supply | Qty: 180 | Fill #0 | Status: TO

## 2018-05-12 ENCOUNTER — Telehealth: Payer: Self-pay | Admitting: Certified Nurse Midwife

## 2018-05-12 NOTE — Telephone Encounter (Signed)
Call to patient's mother, Jennifer [name], okay to speak with per DPR. Jennifer [name] states that the POP does not seem to be providing the "same level of mental health support" for Jennifer [name] as the previous pill she was on. Patient's mother states [name] did not have a period this past month and that seemed to heighten the PMS symptoms. Patient's mother asking for options after reviewing with Psychiatrist a birth control that would help with the PMS symptoms and allow [name] to have somewhat of a period. RN offered OV to come in and discuss options with [name]. Patient's mother agreeable. OV scheduled for 05-16-2018 at 1600. Jennifer [name] agreeable to date and time of appointment.   Routing to provider and will close encounter.

## 2018-05-12 NOTE — Telephone Encounter (Signed)
Patient's mom Thurston Hole ok per dpr calling to speak with nurse about switching her birth control. Mom cell [phone]

## 2018-05-16 ENCOUNTER — Ambulatory Visit (INDEPENDENT_AMBULATORY_CARE_PROVIDER_SITE_OTHER): Payer: No Typology Code available for payment source | Admitting: Certified Nurse Midwife

## 2018-05-16 ENCOUNTER — Other Ambulatory Visit: Payer: Self-pay

## 2018-05-16 ENCOUNTER — Encounter: Payer: Self-pay | Admitting: Certified Nurse Midwife

## 2018-05-16 VITALS — BP 102/62 | HR 64 | Resp 16 | Wt 182.0 lb

## 2018-05-16 DIAGNOSIS — Z3041 Encounter for surveillance of contraceptive pills: Secondary | ICD-10-CM | POA: Diagnosis not present

## 2018-05-16 DIAGNOSIS — Z8669 Personal history of other diseases of the nervous system and sense organs: Secondary | ICD-10-CM | POA: Diagnosis not present

## 2018-05-16 NOTE — Progress Notes (Signed)
Review of Systems  Constitutional: Negative.   HENT: Negative.   Eyes: Negative.   Respiratory: Negative.   Cardiovascular: Negative.   Gastrointestinal: Negative.   Genitourinary: Negative.   Musculoskeletal: Negative.   Skin: Negative.   Neurological: Negative.   Endo/Heme/Allergies: Negative.   Psychiatric/Behavioral: The patient is nervous/anxious.        Mood swings have increased and not feel myself, since on POP.    28 y.o. Single Caucasian [id number] here for evaluation of Micronor initiated on 01/2018 for cycle control and increase in headaches. Mother with patient today. Patient feels headaches are not worse, but tired of them. Has not seen neurology recently per mother. Sees counselor for anxiety and using Aspirin for headache with relief. Sees PCP for aex. Mother feels headaches are better and moods unchanged, but feels more anxious. Menses duration 2 days with light flow. LMP no period.  Patient taking medication as prescribed. Denies missed pills,  nausea, DVT warning signs or symptoms,  breakthrough bleeding, or other changes.   Keeping menses calendar. No other health issues today  O: Healthy female, WD WN Affect: normal orientation X 3    A: History of dysmenorrhea, with periods, switched to Micronor due to increased headaches. Working well for cycle control and amenorrhea with last cycle. Headaches no change and no aura. Recent eye glasses change.  P: Discussed no concerning information regarding Micronor use and would recommend continued use. Discussed neurology visit again for evaluation of headache. Mother feels this is needed and will advise if referral is needed. Warning signs with headaches reviewed. Questions answered.  Rv prn as above   20 minutes spent with patient and mother  face to face counseling regarding medication use.

## 2018-06-05 MED FILL — AMANTADINE HCL 100 MG TABS: 100 | 90 days supply | Qty: 180 | Fill #0

## 2018-06-07 MED FILL — QUETIAPINE FUMARATE 50 MG T: 50 | 90 days supply | Qty: 180 | Fill #0

## 2018-06-22 ENCOUNTER — Ambulatory Visit (INDEPENDENT_AMBULATORY_CARE_PROVIDER_SITE_OTHER): Payer: No Typology Code available for payment source | Admitting: Neurology

## 2018-06-22 ENCOUNTER — Encounter: Payer: Self-pay | Admitting: Neurology

## 2018-06-22 DIAGNOSIS — G43019 Migraine without aura, intractable, without status migrainosus: Secondary | ICD-10-CM | POA: Diagnosis not present

## 2018-06-22 HISTORY — DX: Migraine without aura, intractable, without status migrainosus: G43.019

## 2018-06-22 MED ORDER — ERENUMAB-AOOE 140 MG/ML ~~LOC~~ SOAJ: 140.0000 mg | SUBCUTANEOUS | 4 refills | Status: DC

## 2018-06-22 MED ORDER — RIZATRIPTAN BENZOATE 10 MG PO TABS: 10.0000 mg | ORAL_TABLET | Freq: Three times a day (TID) | ORAL | 3 refills | Status: DC | PRN

## 2018-06-22 NOTE — Progress Notes (Signed)
Virtual Visit via Video Note  I connected with Jennifer [name] on 06/22/18 at  4:00 PM EDT by a video enabled telemedicine application and verified that I am speaking with the correct person using two identifiers.   I discussed the limitations of evaluation and management by telemedicine and the availability of in person appointments. The patient expressed understanding and agreed to proceed.  History of Present Illness: Jennifer [name] is a 28 year old right-handed white female with a history of tremor.  The patient has been seen through this office 5 years ago for this issue.  She has been treated with propranolol and in the past she was given Topamax but could not tolerate the medication because of cognitive side effects.  She has a history of fibromyalgia as well.  She currently is on Lyrica for her fibromyalgia and she takes Seroquel at night secondary history of depression and psychosis.  She began having headaches in November 2019.  Around that time, she was switched off of her estrogen-based birth control to a progesterone-based medication.  The headaches seem to improve, they initially were daily in nature but now are occurring 2-3 times a week.  The patient will at times have severe headaches, she has photophobia and phonophobia.  The headaches may be in the front and the temporal regions or in the occipital area.  She denies any nausea vomiting, occasionally she may have some flashing lights.  The patient does have a family history, there are 2 maternal aunts with migraine.  The patient particularly notes that loud noises are bothersome to her during the headache.  She has been given Fioricet to take if needed which has been of some help, aspirin also helps.  She is not on any daily medication to prevent the headache other than what is mentioned above with the Seroquel, Lyrica, and propranolol.  She denies any numbness or weakness of the face, arms, legs.  She has not had any significant  changes in balance.  She does not take in caffeinated products during the day.   Observations/Objective: WebEx evaluation reveals that the patient is alert and cooperative.  She is able to answer questions appropriately.  She has full extraocular movements.  Face is symmetric, she is able to protrude the tongue in the midline with good lateral movements of the tongue.  She has good finger-nose-finger and heel-to-shin bilaterally.  Gait is normal.  Tandem gait normal.  Romberg is negative.  No drift is seen.  Assessment and Plan: 1.  Common migraine headache, intractable  2.  History of tremor  The patient has not been able to tolerate Topamax previously.  She is already on propranolol, the dose was recently increased with her headaches are continuing.  She is on Seroquel which may help migraine and on Lyrica.  The headaches are continuing, for this reason she will be started on Aimovig.  The patient will be given Maxalt to take if needed for the headache.  She can still take aspirin.  She will follow-up here in 3 months.  Follow Up Instructions: 70-month follow-up, may see nurse practitioner.   I discussed the assessment and treatment plan with the patient. The patient was provided an opportunity to ask questions and all were answered. The patient agreed with the plan and demonstrated an understanding of the instructions.   The patient was advised to call back or seek an in-person evaluation if the symptoms worsen or if the condition fails to improve as anticipated.  I  provided 30 minutes of non-face-to-face time during this encounter.   York Spanielharles K Cordney Barstow, MD

## 2018-06-23 MED FILL — RIZATRIPTAN BENZOATE 10 MG: 10 | 30 days supply | Qty: 10 | Fill #0

## 2018-06-27 ENCOUNTER — Telehealth: Payer: Self-pay

## 2018-06-27 NOTE — Telephone Encounter (Signed)
PA for Aimovig has been submitted via cover my Meds. Key [id number]. Will follow on cover my meds has tried propranolol and Topamax.

## 2018-06-28 MED FILL — AIMOVIG 140 MG/ML SOAJ: 140 | 28 days supply | Qty: 1 | Fill #0

## 2018-06-28 NOTE — Telephone Encounter (Signed)
PA approval received from [employer] effective 06/23/2018-12/22/2018.

## 2018-06-30 MED FILL — PREGABALIN 100 MG CAPS: 100 | 90 days supply | Qty: 270 | Fill #0

## 2018-07-04 MED FILL — ALBUTEROL SULFATE HFA 108 (: 108 (90 BAS | 30 days supply | Qty: 9 | Fill #0

## 2018-07-07 ENCOUNTER — Institutional Professional Consult (permissible substitution): Payer: Managed Care, Other (non HMO) | Admitting: Neurology

## 2018-07-17 ENCOUNTER — Other Ambulatory Visit: Payer: Self-pay | Admitting: Certified Nurse Midwife

## 2018-07-17 DIAGNOSIS — G43101 Migraine with aura, not intractable, with status migrainosus: Secondary | ICD-10-CM

## 2018-07-17 DIAGNOSIS — N946 Dysmenorrhea, unspecified: Secondary | ICD-10-CM

## 2018-07-17 NOTE — Telephone Encounter (Signed)
Medication refill request: norlyda 0.35mg  Last AEX:  09-30-17 Next AEX: 10-06-2018 Last MMG (if hormonal medication request): 2016 left breast u/s neg Refill authorized: last refilled 11/19 3 packs with 1 refill. Please approve if appropriate

## 2018-07-18 MED FILL — NORETHINDRONE 0.35 MG TAB: 0.35 | 84 days supply | Qty: 84 | Fill #0

## 2018-07-31 MED FILL — AIMOVIG 140 MG/ML SOAJ: 140 | 28 days supply | Qty: 1 | Fill #1

## 2018-08-09 MED FILL — FLUoxetine HCL 20 MG CAPS: 20 | 90 days supply | Qty: 270 | Fill #0

## 2018-08-18 MED FILL — PROPRANOLOL 40 MG TABLET: 40 | 90 days supply | Qty: 180 | Fill #0

## 2018-09-04 MED FILL — QUETIAPINE FUMARATE 50 MG T: 50 | 90 days supply | Qty: 180 | Fill #1

## 2018-09-07 MED FILL — AMANTADINE HCL 100 MG TABS: 100 | 90 days supply | Qty: 180 | Fill #0

## 2018-09-11 ENCOUNTER — Other Ambulatory Visit: Payer: Self-pay | Admitting: Obstetrics and Gynecology

## 2018-09-11 DIAGNOSIS — N946 Dysmenorrhea, unspecified: Secondary | ICD-10-CM

## 2018-09-11 DIAGNOSIS — G43101 Migraine with aura, not intractable, with status migrainosus: Secondary | ICD-10-CM

## 2018-09-11 MED FILL — PREGABALIN 100 MG CAPS: 100 | 90 days supply | Qty: 270 | Fill #1

## 2018-09-11 NOTE — Telephone Encounter (Signed)
Medication refill request: POP Last AEX:  09-30-17 DL  Next AEX: 10-06-2018 Last MMG (if hormonal medication request): n/a Refill authorized: 07-18-2018 #84, 0RF. Today, please advise.   Medication pended for #28, 0RF as patient has aex on 10-06-2018. Please refill if appropriate.

## 2018-09-14 MED FILL — RIZATRIPTAN BENZOATE 10 MG: 10 | 30 days supply | Qty: 10 | Fill #1

## 2018-09-20 NOTE — Progress Notes (Signed)
PATIENT: Jennifer PaganiniHannah L [name] DOB: 05/06/1990  REASON FOR VISIT: follow up HISTORY FROM: patient  HISTORY OF PRESENT ILLNESS: Today 09/21/18  [name] is a 28 year old female with history of tremor and headaches.  In the past, she has been unable to tolerate Topamax.  At her last visit she was started on Aimovig.  She remains on propanolol, Seroquel, and Lyrica, all that may help migraine.  She takes Maxalt as needed. The Aimovig has been beneficial for migraines, about 50%. She reports continued worsening headaches before and during her period. The Maxalt is helpful for headache. She is taking all 10 tablets monthly. She remains on progesterone only birth control. She presents today for follow-up accompanied by her mother. When not around the the time of her headaches, she may have 1-2 headaches a week.   HISTORY 06/22/2018 Dr. Anne HahnWillis: Jennifer [name] is a 28 year old right-handed white female with a history of tremor.  The patient has been seen through this office 5 years ago for this issue.  She has been treated with propranolol and in the past she was given Topamax but could not tolerate the medication because of cognitive side effects.  She has a history of fibromyalgia as well.  She currently is on Lyrica for her fibromyalgia and she takes Seroquel at night secondary history of depression and psychosis.  She began having headaches in November 2019.  Around that time, she was switched off of her estrogen-based birth control to a progesterone-based medication.  The headaches seem to improve, they initially were daily in nature but now are occurring 2-3 times a week.  The patient will at times have severe headaches, she has photophobia and phonophobia.  The headaches may be in the front and the temporal regions or in the occipital area.  She denies any nausea vomiting, occasionally she may have some flashing lights.  The patient does have a family history, there are 2 maternal aunts with migraine.  The  patient particularly notes that loud noises are bothersome to her during the headache.  She has been given Fioricet to take if needed which has been of some help, aspirin also helps.  She is not on any daily medication to prevent the headache other than what is mentioned above with the Seroquel, Lyrica, and propranolol.  She denies any numbness or weakness of the face, arms, legs.  She has not had any significant changes in balance.  She does not take in caffeinated products during the day.   REVIEW OF SYSTEMS: Out of a complete 14 system review of symptoms, the patient complains only of the following symptoms, and all other reviewed systems are negative.  Headache   ALLERGIES: Allergies  Allergen Reactions  . Ambien [Zolpidem Tartrate]     hallucinations  . Cogentin [Benztropine]     Rash, itching, dry mouth  . Topamax [Topiramate]     Cognitive side effects    HOME MEDICATIONS: Outpatient Medications Prior to Visit  Medication Sig Dispense Refill  . amantadine (SYMMETREL) 100 MG capsule Take 1 capsule (100 mg total) by mouth 2 (two) times daily. 60 capsule 0  . Amantadine HCl 100 MG tablet     . Butalbital-APAP-Caffeine 50-325-40 MG capsule TK 1 TS PO Q 8 H PRF SEVERE MIGRAINE PAIN  0  . fexofenadine (ALLEGRA) 180 MG tablet Take 180 mg by mouth daily as needed for allergies.     Marland Kitchen. FLUoxetine (PROZAC) 20 MG capsule TAKE ONE CAPSULE BY MOUTH ONCE DAILY IN THE  MORNING AND TWO CAPSULES AT NOON  2  . LYRICA 100 MG capsule One by mouth 3 times a day (Patient taking differently: Take 100 mg by mouth 3 (three) times daily. ) 93 capsule 5  . MELATONIN PO Take 10 mg by mouth as needed.     . norethindrone (MICRONOR) 0.35 MG tablet TAKE 1 TABLET BY MOUTH DAILY 28 tablet 0  . PROAIR HFA 108 (90 BASE) MCG/ACT inhaler Inhale 1 puff into the lungs every 6 (six) hours as needed for wheezing or shortness of breath.     . propranolol (INDERAL) 40 MG tablet Takes 40mg  every morning & at 2pm    .  QUEtiapine (SEROQUEL) 50 MG tablet Take 1 tablet (50 mg total) by mouth at bedtime. (Patient taking differently: Take 100 mg by mouth at bedtime. ) 30 tablet 0  . Erenumab-aooe (AIMOVIG) 140 MG/ML SOAJ Inject 140 mg into the skin every 30 (thirty) days. 1 pen 4  . rizatriptan (MAXALT) 10 MG tablet Take 1 tablet (10 mg total) by mouth 3 (three) times daily as needed for migraine. 10 tablet 3   No facility-administered medications prior to visit.     PAST MEDICAL HISTORY: Past Medical History:  Diagnosis Date  . Asthma   . Common migraine with intractable migraine 06/22/2018  . Disturbance of skin sensation 12/04/2013  . Environmental allergies    seasonal  . Fibromyalgia   . Memory loss   . Migraines    no aura  . Occasional tremors   . Psychosis ([place])   . Schizophrenia ([place])   . 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 No pertinent surgical history.  FAMILY HISTORY: Family History  Problem Relation Age of Onset  . Breast cancer Mother        BRAC negative  age 81  . Diabetes Father   . Hypertension Father   . Pulmonary embolism Father   . Thyroid disease Maternal Aunt   . Breast cancer Maternal Aunt 69       BRAC negative  . Thyroid disease Maternal Grandmother   . Alzheimer's disease Maternal Grandfather   . Lung cancer Paternal Grandmother   . Depression Paternal Grandmother   . Heart attack Paternal Grandfather   . Depression Paternal Grandfather     SOCIAL HISTORY: Social History   Socioeconomic History  . Marital status: Single    Spouse name: Not on file  . Number of children: 0  . Years of education: college/st  . Highest education level: Not on file  Occupational History  . Occupation: Lexicographer: UNEMPLOYED  Social Needs  . Financial resource strain: Not on file  . Food insecurity    Worry: Not on file    Inability: Not on file  . Transportation needs    Medical: Not on file    Non-medical: Not  on file  Tobacco Use  . Smoking status: Never Smoker  . Smokeless tobacco: Never Used  Substance and Sexual Activity  . Alcohol use: No  . Drug use: No  . Sexual activity: Never    Partners: Male    Birth control/protection: Pill    Comment: never sexually active  Lifestyle  . Physical activity    Days per week: Not on file    Minutes per session: Not on file  . Stress: Not on file  Relationships  . Social connections    Talks on phone: Not on file  Gets together: Not on file    Attends religious service: Not on file    Active member of club or organization: Not on file    Attends meetings of clubs or organizations: Not on file    Relationship status: Not on file  . Intimate partner violence    Fear of current or ex partner: Not on file    Emotionally abused: Not on file    Physically abused: Not on file    Forced sexual activity: Not on file  Other Topics Concern  . Not on file  Social History Narrative   Patient is right handed.   Patient does not drink caffeine.   Patient lives at home with family.      PHYSICAL EXAM  Vitals:   09/21/18 0903  BP: 112/60  Pulse: 93  Temp: 97.8 F (36.6 C)  TempSrc: Oral  Weight: 189 lb 3.2 oz (85.8 kg)  Height: 5\' 4"  (1.626 m)   Body mass index is 32.48 kg/m.  Generalized: Well developed, in no acute distress   Neurological examination  Mentation: Alert oriented to time, place, history taking, most of history provided by her mother. Follows all commands speech and language fluent Cranial nerve II-XII: Pupils were equal round reactive to light. Extraocular movements were full, visual field were full on confrontational test. Facial sensation and strength were normal. Uvula tongue midline. Head turning and shoulder shrug  were normal and symmetric. Motor: The motor testing reveals 5 over 5 strength of all 4 extremities. Good symmetric motor tone is noted throughout. Mild intention tremor noted in hands, more in the right  Sensory: Sensory testing is intact to soft touch on all 4 extremities. No evidence of extinction is noted.  Coordination: Cerebellar testing reveals good finger-nose-finger and heel-to-shin bilaterally.  Gait and station: Gait is normal. Tandem gait is unsteady.  Reflexes: Deep tendon reflexes are symmetric and normal bilaterally.   DIAGNOSTIC DATA (LABS, IMAGING, TESTING) - I reviewed patient records, labs, notes, testing and imaging myself where available.  Lab Results  Component Value Date   WBC 10.4 02/22/2016   HGB 14.1 02/22/2016   HCT 42.5 02/22/2016   MCV 89.7 02/22/2016   PLT 283 02/22/2016      Component Value Date/Time   NA 135 02/22/2016 2332   K 3.9 02/22/2016 2332   CL 105 02/22/2016 2332   CO2 23 02/22/2016 2332   GLUCOSE 116 (H) 02/22/2016 2332   BUN 17 02/22/2016 2332   CREATININE 0.61 02/22/2016 2332   CALCIUM 9.2 02/22/2016 2332   PROT 7.7 02/22/2016 2332   ALBUMIN 3.6 02/22/2016 2332   AST 17 02/22/2016 2332   ALT 20 02/22/2016 2332   ALKPHOS 75 02/22/2016 2332   BILITOT 0.3 02/22/2016 2332   GFRNONAA >60 02/22/2016 2332   GFRAA >60 02/22/2016 2332   Lab Results  Component Value Date   CHOL 229 (H) 07/12/2015   HDL 52 07/12/2015   LDLCALC 155 (H) 07/12/2015   TRIG 111 07/12/2015   CHOLHDL 4.4 07/12/2015   Lab Results  Component Value Date   HGBA1C 5.4 07/12/2015   Lab Results  Component Value Date   VITAMINB12 719 01/16/2013   Lab Results  Component Value Date   TSH 1.670 07/12/2015      ASSESSMENT AND PLAN 28 y.o. 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 here with:  1.  Migraine headache  She has seen good benefit with Aimovig 140 mg monthly injection.  She  has had 2 injection so far, so she could still have added benefit.  She will continue the medication, Maxalt as needed.  She remains on Lyrica, propanolol, Seroquel, all which may improve her migraine.  Her headaches are worse the week before and during her menstrual cycle.  Otherwise, she is having 1-2 headaches per week.  She will follow-up in 6 months or sooner if needed. I did consider Frova for migraine prevention, but at this time her periods are not regular.    I spent 15 minutes with the patient. 50% of this time was spent discussing her plan of care   Otila KluverSarah Korah Hufstedler, AGNP-C, DNP 09/21/2018, 9:45 AM [facility] [address], [address] [place], [place] [address] [phone]

## 2018-09-21 ENCOUNTER — Encounter: Payer: Self-pay | Admitting: Neurology

## 2018-09-21 ENCOUNTER — Ambulatory Visit: Payer: No Typology Code available for payment source | Admitting: Neurology

## 2018-09-21 ENCOUNTER — Other Ambulatory Visit: Payer: Self-pay

## 2018-09-21 VITALS — BP 112/60 | HR 93 | Temp 97.8°F | Ht 64.0 in | Wt 189.2 lb

## 2018-09-21 DIAGNOSIS — G43019 Migraine without aura, intractable, without status migrainosus: Secondary | ICD-10-CM

## 2018-09-21 MED ORDER — AIMOVIG 140 MG/ML ~~LOC~~ SOAJ: 140.0000 mg | SUBCUTANEOUS | 4 refills | Status: DC

## 2018-09-21 MED ORDER — RIZATRIPTAN BENZOATE 10 MG PO TABS: 10.0000 mg | ORAL_TABLET | Freq: Three times a day (TID) | ORAL | 3 refills | Status: DC | PRN

## 2018-09-21 MED FILL — AIMOVIG 140 MG/ML SOAJ: 140 | 30 days supply | Qty: 1 | Fill #0

## 2018-09-21 NOTE — Progress Notes (Signed)
I have read the note, and I agree with the clinical assessment and plan.  Charles K Willis   

## 2018-09-21 NOTE — Patient Instructions (Signed)
It was great to see you! You look great! I will see you in 6 months!

## 2018-09-25 ENCOUNTER — Other Ambulatory Visit: Payer: Self-pay | Admitting: Obstetrics and Gynecology

## 2018-09-25 DIAGNOSIS — N946 Dysmenorrhea, unspecified: Secondary | ICD-10-CM

## 2018-09-25 DIAGNOSIS — G43101 Migraine with aura, not intractable, with status migrainosus: Secondary | ICD-10-CM

## 2018-09-25 MED FILL — NORETHINDRONE 0.35 MG TAB: 0.35 | 28 days supply | Qty: 28 | Fill #0

## 2018-09-25 NOTE — Telephone Encounter (Signed)
Medication refill request: Micronor  Last AEX:  05/16/18 Next AEX: 10/06/18 Last MMG (if hormonal medication request): NA Refill authorized: #[id number] with no refills to get her to appointment next week.

## 2018-09-26 MED FILL — predniSONE 10 MG (21) TBPK: 10 | 6 days supply | Qty: 21 | Fill #0

## 2018-09-26 MED FILL — METHOCARBAMOL 750 MG TABS: 750 | 15 days supply | Qty: 45 | Fill #0

## 2018-09-26 MED FILL — MELOXICAM 15 MG TABLET: 15 | 14 days supply | Qty: 14 | Fill #0

## 2018-09-27 ENCOUNTER — Encounter: Payer: Self-pay | Admitting: Neurology

## 2018-10-06 ENCOUNTER — Ambulatory Visit (INDEPENDENT_AMBULATORY_CARE_PROVIDER_SITE_OTHER): Payer: No Typology Code available for payment source | Admitting: Certified Nurse Midwife

## 2018-10-06 ENCOUNTER — Encounter: Payer: Self-pay | Admitting: Certified Nurse Midwife

## 2018-10-06 ENCOUNTER — Other Ambulatory Visit: Payer: Self-pay

## 2018-10-06 VITALS — BP 110/72 | HR 68 | Temp 97.3°F | Resp 16 | Ht 64.75 in | Wt 188.0 lb

## 2018-10-06 DIAGNOSIS — G43101 Migraine with aura, not intractable, with status migrainosus: Secondary | ICD-10-CM

## 2018-10-06 DIAGNOSIS — N946 Dysmenorrhea, unspecified: Secondary | ICD-10-CM | POA: Diagnosis not present

## 2018-10-06 DIAGNOSIS — Z3041 Encounter for surveillance of contraceptive pills: Secondary | ICD-10-CM | POA: Diagnosis not present

## 2018-10-06 DIAGNOSIS — Z01419 Encounter for gynecological examination (general) (routine) without abnormal findings: Secondary | ICD-10-CM

## 2018-10-06 MED ORDER — NORETHINDRONE 0.35 MG PO TABS: 1.0000 | ORAL_TABLET | Freq: Every day | ORAL | 4 refills | Status: DC

## 2018-10-06 NOTE — Progress Notes (Signed)
28 y.o. G0P0000 Single  Caucasian Fe here for annual exam. Mother guardian with Jennifer [name]. POP working well, much lighter period and no cramping now. She feels this is a better choice. Denies any side effects with use.  Has started on Amvig injectable for headaches and this is working well. Continues with Neurologist and PCP with medication management. Not sexually active ever. No other health issues today.  Patient's last menstrual period was 09/15/2018 (exact date).          Sexually active: No. never The current method of family planning is oral progesterone-only contraceptive.    Exercising: No.  exercise Smoker:  no  Review of Systems  Constitutional: Negative.   HENT: Negative.   Eyes: Negative.   Respiratory: Negative.   Cardiovascular: Negative.   Gastrointestinal: Negative.   Genitourinary: Negative.   Musculoskeletal: Negative.   Skin: Negative.   Neurological: Negative.   Endo/Heme/Allergies: Negative.   Psychiatric/Behavioral: Negative.     Health Maintenance: Pap:  09-20-14 neg, 09-30-17 neg History of Abnormal Pap: no MMG:  Left breast u/s 7/16 birads 1:neg Self Breast exams: yes Colonoscopy:  none BMD:   none TDaP:  2018 Shingles: no Pneumonia: no Hep C and HIV: not done Labs: only if needed   reports that she has never smoked. She has never used smokeless tobacco. She reports that she does not drink alcohol or use drugs.  Past Medical History:  Diagnosis Date  . Asthma   . Common migraine with intractable migraine 06/22/2018  . Disturbance of skin sensation 12/04/2013  . Environmental allergies    seasonal  . Fibromyalgia   . Memory loss   . Migraines    no aura  . Occasional tremors   . Psychosis ([facility])   . Schizophrenia ([facility])   . 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 No pertinent surgical history.  Current Outpatient Medications  Medication Sig Dispense Refill  . amantadine (SYMMETREL) 100 MG capsule Take 1 capsule (100 mg  total) by mouth 2 (two) times daily. 60 capsule 0  . Butalbital-APAP-Caffeine 50-325-40 MG capsule TK 1 TS PO Q 8 H PRF SEVERE MIGRAINE PAIN  0  . cetirizine (ZYRTEC) 10 MG tablet Take 10 mg by mouth daily.    Dorise Hiss. Erenumab-aooe (AIMOVIG) 140 MG/ML SOAJ Inject 140 mg into the skin every 30 (thirty) days. 1 pen 4  . FLUoxetine (PROZAC) 20 MG capsule TAKE ONE CAPSULE BY MOUTH ONCE DAILY IN THE MORNING AND TWO CAPSULES AT NOON  2  . LYRICA 100 MG capsule One by mouth 3 times a day (Patient taking differently: Take 100 mg by mouth 3 (three) times daily. ) 93 capsule 5  . MELATONIN PO Take 10 mg by mouth as needed.     . meloxicam (MOBIC) 15 MG tablet     . methocarbamol (ROBAXIN) 750 MG tablet as needed.    . Multiple Vitamin (MULTIVITAMIN PO) Take by mouth.    . norethindrone (MICRONOR) 0.35 MG tablet TAKE 1 TABLET BY MOUTH DAILY 28 tablet 0  . PROAIR HFA 108 (90 BASE) MCG/ACT inhaler Inhale 1 puff into the lungs every 6 (six) hours as needed for wheezing or shortness of breath.     . propranolol (INDERAL) 40 MG tablet Takes 40mg  every morning & at 2pm    . QUEtiapine (SEROQUEL) 50 MG tablet Take 1 tablet (50 mg total) by mouth at bedtime. (Patient taking differently: Take 100 mg by mouth at bedtime. ) 30 tablet 0  .  rizatriptan (MAXALT) 10 MG tablet Take 1 tablet (10 mg total) by mouth 3 (three) times daily as needed for migraine. 10 tablet 3   No current facility-administered medications for this visit.     Family History  Problem Relation Age of Onset  . Breast cancer Mother        BRAC negative  age 70  . Diabetes Father   . Hypertension Father   . Pulmonary embolism Father   . Thyroid disease Maternal Aunt   . Breast cancer Maternal Aunt 57       BRAC negative  . Thyroid disease Maternal Grandmother   . Alzheimer's disease Maternal Grandfather   . Lung cancer Paternal Grandmother   . Depression Paternal Grandmother   . Heart attack Paternal Grandfather   . Depression Paternal  Grandfather     ROS:  Pertinent items are noted in HPI.  Otherwise, a comprehensive ROS was negative.  Exam:   BP 110/72   Pulse 68   Temp (!) 97.3 F (36.3 C) (Skin)   Resp 16   Ht 5' 4.75" (1.645 m)   Wt 188 lb (85.3 kg)   LMP 09/15/2018 (Exact Date)   BMI 31.53 kg/m  Height: 5' 4.75" (164.5 cm) Ht Readings from Last 3 Encounters:  10/06/18 5' 4.75" (1.645 m)  09/21/18 5\' 4"  (1.626 m)  09/30/17 5\' 4"  (1.626 m)    General appearance: alert, cooperative and appears stated age Head: Normocephalic, without obvious abnormality, atraumatic Neck: no adenopathy, supple, symmetrical, trachea midline and thyroid normal to inspection and palpation Lungs: clear to auscultation bilaterally Breasts: normal appearance, no masses or tenderness, No nipple retraction or dimpling, No nipple discharge or bleeding, No axillary or supraclavicular adenopathy Heart: regular rate and rhythm Abdomen: soft, non-tender; no masses,  no organomegaly Extremities: extremities normal, atraumatic, no cyanosis or edema Skin: Skin color, texture, turgor normal. No rashes or lesions Lymph nodes: Cervical, supraclavicular, and axillary nodes normal. No abnormal inguinal nodes palpated Neurologic: Grossly normal   Pelvic: External genitalia:  no lesions              Urethra:  normal appearing urethra with no masses, tenderness or lesions              Bartholin's and Skene's: normal                 Vagina: normal appearing vagina with normal color and discharge, no lesions              Cervix: no cervical motion tenderness, no lesions, nulliparous appearance and very small introitus              Pap taken: No. Bimanual Exam:  Uterus:  normal size, contour, position, consistency, mobility, non-tender and anteverted              Adnexa: normal adnexa and no mass, fullness, tenderness               Rectovaginal: Confirms               Anus:  normal sphincter tone, no lesions  Chaperone present: yes  A:  Well  Woman with normal exam  Dysmenorrhea POP use for cycle control working well.  Parkinsonism, ADHD, Essential tremor, Paranoid schizophrenia with Neurology management.    P:   Reviewed health and wellness pertinent to exam  Discussed expectations of period and bleeding profile with POP use. Questions addressed. Desires continuance.  Rx Micronor see order with instructions  Continue follow with MD as indicated.  Pap smear: no   counseled on breast self exam, feminine hygiene, use and side effects of OCP's, adequate intake of calcium and vitamin D, diet and exercise, breast SBE  return annually or prn  An After Visit Summary was printed and given to the patient.

## 2018-10-19 MED FILL — RIZATRIPTAN BENZOATE 10 MG: 10 | 30 days supply | Qty: 10 | Fill #2

## 2018-10-29 MED FILL — FLUoxetine HCL 20 MG CAPS: 20 | 90 days supply | Qty: 270 | Fill #1

## 2018-10-30 ENCOUNTER — Other Ambulatory Visit: Payer: Self-pay | Admitting: Obstetrics and Gynecology

## 2018-10-30 DIAGNOSIS — G43101 Migraine with aura, not intractable, with status migrainosus: Secondary | ICD-10-CM

## 2018-10-30 DIAGNOSIS — N946 Dysmenorrhea, unspecified: Secondary | ICD-10-CM

## 2018-10-30 MED FILL — AIMOVIG 140 MG/ML SOAJ: 140 | 30 days supply | Qty: 1 | Fill #1

## 2018-10-30 MED FILL — NORETHINDRONE 0.35 MG TAB: 0.35 | 28 days supply | Qty: 28 | Fill #0

## 2018-11-22 MED FILL — PROPRANOLOL 40 MG TABLET: 40 | 90 days supply | Qty: 180 | Fill #0

## 2018-12-04 ENCOUNTER — Other Ambulatory Visit: Payer: Self-pay | Admitting: Obstetrics and Gynecology

## 2018-12-04 DIAGNOSIS — N946 Dysmenorrhea, unspecified: Secondary | ICD-10-CM

## 2018-12-04 DIAGNOSIS — G43101 Migraine with aura, not intractable, with status migrainosus: Secondary | ICD-10-CM

## 2018-12-04 MED FILL — NORETHINDRONE 0.35 MG TAB: 0.35 | 84 days supply | Qty: 84 | Fill #0

## 2018-12-04 MED FILL — AIMOVIG 140 MG/ML SOAJ: 140 | 28 days supply | Qty: 1 | Fill #2

## 2018-12-04 MED FILL — AMANTADINE 100 MG TABLET: 100 | 90 days supply | Qty: 180 | Fill #1

## 2018-12-04 NOTE — Telephone Encounter (Signed)
Medication refill request: Micronor Last AEX:  10/06/2018 DL Next AEX: 10/09/2019 Last MMG (if hormonal medication request): n/a Refill authorized: Pending #[id number] with 3 refills if appropriate. Please advise.

## 2018-12-05 MED FILL — QUETIAPINE FUMARATE 50 MG T: 50 | 90 days supply | Qty: 180 | Fill #0

## 2018-12-24 MED FILL — PREGABALIN 100 MG CAPS: 100 | 90 days supply | Qty: 270 | Fill #2

## 2018-12-30 MED FILL — predniSONE 10 MG (21) TBPK: 10 | 6 days supply | Qty: 21 | Fill #0

## 2019-01-11 ENCOUNTER — Emergency Department (HOSPITAL_COMMUNITY)
Admission: EM | Admit: 2019-01-11 | Discharge: 2019-01-11 | Disposition: A | Discharge status: Home / Self Care | Payer: No Typology Code available for payment source | Attending: Emergency Medicine | Admitting: Emergency Medicine

## 2019-01-11 ENCOUNTER — Encounter (HOSPITAL_COMMUNITY): Payer: Self-pay | Admitting: Emergency Medicine

## 2019-01-11 ENCOUNTER — Other Ambulatory Visit: Payer: Self-pay

## 2019-01-11 DIAGNOSIS — Z79899 Other long term (current) drug therapy: Secondary | ICD-10-CM | POA: Diagnosis not present

## 2019-01-11 DIAGNOSIS — T7840XA Allergy, unspecified, initial encounter: Secondary | ICD-10-CM | POA: Diagnosis not present

## 2019-01-11 DIAGNOSIS — J45909 Unspecified asthma, uncomplicated: Secondary | ICD-10-CM | POA: Insufficient documentation

## 2019-01-11 DIAGNOSIS — R21 Rash and other nonspecific skin eruption: Secondary | ICD-10-CM | POA: Diagnosis present

## 2019-01-11 MED ORDER — FAMOTIDINE IN NACL 20-0.9 MG/50ML-% IV SOLN
20.0000 mg | Freq: Once | INTRAVENOUS | Status: AC
  Administered 2019-01-11: 20 mg via INTRAVENOUS
  Filled 2019-01-11: qty 50

## 2019-01-11 MED ORDER — DIPHENHYDRAMINE HCL 50 MG/ML IJ SOLN
25.0000 mg | Freq: Once | INTRAMUSCULAR | Status: AC
  Administered 2019-01-11: 21:00:00 25 mg via INTRAVENOUS
  Filled 2019-01-11: qty 1

## 2019-01-11 NOTE — ED Triage Notes (Addendum)
Pt began to develop rash and flush all over her body. Pt reports being SOB so she took a breathing tx with no relief. Denies lip or tongue swelling. Pt has taken 50mg  benadryl with no relief.

## 2019-01-11 NOTE — Discharge Instructions (Signed)
Your vital signs are within normal limits.  Your oxygen level is 98% on room air, which is within normal limits.  Your rash and allergic reaction has responded well to IV medication.  Please asked Dr. Nevada Crane if you might need an allergy test.  Please use Benadryl should you have this rash again.  Please return to the emergency department if this problem should recur.

## 2019-01-11 NOTE — ED Provider Notes (Signed)
[facility] EMERGENCY DEPARTMENT Provider Note   CSN: [id number] Arrival date & time: 01/11/19  1958     History   Chief Complaint Chief Complaint  Patient presents with  . Allergic Reaction    HPI Jennifer [name] is a 28 y.o. female.     Patient is a 28 year old female who presents to the emergency department with a complaint of allergic reaction.  The patient states that she was in her usual state of health until approximately 30 minutes to an hour before arriving in the emergency department.  She developed a rash that started on her arms, then affected her abdomen, her back, her neck, and her lower extremities.  She said she felt that she had some mild to moderate shortness of breath so she took a albuterol treatment.  She says she has a history of asthma and thought that it may be bothering her because of the weather changes.  This did not seem to help any and the rash was getting worse so took a Benadryl tablet, and she came to the emergency department for evaluation.  The history is provided by the patient and a parent.  Allergic Reaction Presenting symptoms: rash   Presenting symptoms: no wheezing     Past Medical History:  Diagnosis Date  . Asthma   . Common migraine with intractable migraine 06/22/2018  . Disturbance of skin sensation 12/04/2013  . Environmental allergies    seasonal  . Fibromyalgia   . Memory loss   . Migraines    no aura  . Occasional tremors   . Psychosis (HCC)   . Schizophrenia (HCC)   . 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 Common migraine with intractable migraine 06/22/2018  . Tremor, essential 07/11/2015  . History of attention deficit hyperactivity disorder (ADHD) 07/11/2015  . Paranoid schizophrenia (HCC) 07/10/2015  . Motor tic disorder 01/07/2015  . 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 Fibromyalgia 09/23/2014  . Disturbance of skin sensation 12/04/2013  . Body  mass index, pediatric, 85th percentile to less than 95th percentile for age 17/04/2012  . Essential and other specified forms of tremor 12/04/2012  . Myalgia and myositis, unspecified 12/04/2012  . Laxity of ligament 12/04/2012    History reviewed. No pertinent surgical history.   OB History    Gravida  0   Para  0   Term  0   Preterm  0   AB  0   Living  0     SAB  0   TAB  0   Ectopic  0   Multiple  0   Live Births               Home Medications    Prior to Admission medications   Medication Sig Start Date End Date Taking? Authorizing Provider  amantadine (SYMMETREL) 100 MG capsule Take 1 capsule (100 mg total) by mouth 2 (two) times daily. 07/14/15   Oneta Rack, NP  Amantadine HCl 100 MG tablet  12/04/18   [provider]  Butalbital-APAP-Caffeine 50-325-40 MG capsule TK 1 TS PO Q 8 H PRF SEVERE MIGRAINE PAIN 01/09/18   [provider]  cetirizine (ZYRTEC) 10 MG tablet Take 10 mg by mouth daily.    [provider]  Erenumab-aooe (AIMOVIG) 140 MG/ML SOAJ Inject 140 mg into the skin every 30 (thirty) days. 09/21/18   Glean Salvo, NP  FLUoxetine (PROZAC) 20 MG  capsule TAKE ONE CAPSULE BY MOUTH ONCE DAILY IN THE MORNING AND TWO CAPSULES AT NOON 08/24/15   [provider]  LYRICA 100 MG capsule One by mouth 3 times a day Patient taking differently: Take 100 mg by mouth 3 (three) times daily.  01/09/13   Deetta Perla, MD  MELATONIN PO Take 10 mg by mouth as needed.     [provider]  meloxicam (MOBIC) 15 MG tablet  09/26/18   [provider]  methocarbamol (ROBAXIN) 750 MG tablet as needed. 09/26/18   [provider]  Multiple Vitamin (MULTIVITAMIN PO) Take by mouth.    [provider]  norethindrone (MICRONOR) 0.35 MG tablet TAKE 1 TABLET BY MOUTH DAILY 12/21/18   Verner Chol, CNM  PROAIR HFA 108 (90 BASE) MCG/ACT inhaler Inhale 1 puff into the lungs every 6 (six) hours as  needed for wheezing or shortness of breath.  07/22/14   [provider]  propranolol (INDERAL) 40 MG tablet Takes 40mg  every morning & at 2pm 05/11/18   [provider]  QUEtiapine (SEROQUEL) 50 MG tablet Take 1 tablet (50 mg total) by mouth at bedtime. Patient taking differently: Take 100 mg by mouth at bedtime.  07/14/15   09/13/15, NP  rizatriptan (MAXALT) 10 MG tablet Take 1 tablet (10 mg total) by mouth 3 (three) times daily as needed for migraine. 09/21/18   11/22/18, NP    Family History Family History  Problem Relation Age of Onset  . Breast cancer Mother        BRAC negative  age 51  . Diabetes Father   . Hypertension Father   . Pulmonary embolism Father   . Thyroid disease Maternal Aunt   . Breast cancer Maternal Aunt 45       BRAC negative  . Thyroid disease Maternal Grandmother   . Alzheimer's disease Maternal Grandfather   . Lung cancer Paternal Grandmother   . Depression Paternal Grandmother   . Heart attack Paternal Grandfather   . Depression Paternal Grandfather     Social History Social History   Tobacco Use  . Smoking status: Never Smoker  . Smokeless tobacco: Never Used  Substance Use Topics  . Alcohol use: No  . Drug use: No     Allergies   Ambien [zolpidem tartrate], Cogentin [benztropine], and Topamax [topiramate]   Review of Systems Review of Systems  Constitutional: Negative for activity change and appetite change.  HENT: Negative for congestion, ear discharge, ear pain, facial swelling, nosebleeds, rhinorrhea, sneezing and tinnitus.   Eyes: Negative for photophobia, pain and discharge.  Respiratory: Positive for shortness of breath. Negative for cough, choking and wheezing.   Cardiovascular: Negative for chest pain, palpitations and leg swelling.  Gastrointestinal: Negative for abdominal pain, blood in stool, constipation, diarrhea, nausea and vomiting.  Genitourinary: Negative for difficulty urinating, dysuria, flank  pain, frequency and hematuria.  Musculoskeletal: Negative for back pain, gait problem, myalgias and neck pain.  Skin: Positive for rash. Negative for color change and wound.  Neurological: Negative for dizziness, seizures, syncope, facial asymmetry, speech difficulty, weakness and numbness.  Hematological: Negative for adenopathy. Does not bruise/bleed easily.  Psychiatric/Behavioral: Negative for agitation, confusion, hallucinations, self-injury and suicidal ideas. The patient is not nervous/anxious.      Physical Exam Updated Vital Signs BP (!) 107/97 (BP Location: Right Arm)   Pulse 99   Temp 98.3 F (36.8 C) (Oral)   Resp 20   SpO2 98%  Physical Exam Vitals signs and nursing note reviewed.  Constitutional:      Appearance: She is well-developed. She is not toxic-appearing.  HENT:     Head: Normocephalic.     Comments: There is mild fullness of the upper and lower lip.  There is no swelling of the tongue.  There is no swelling of the posterior pharynx.  The airway is patent.  The uvula is in the midline.  The speech is clear and understandable.    Right Ear: Tympanic membrane and external ear normal.     Left Ear: Tympanic membrane and external ear normal.  Eyes:     General: Lids are normal.     Pupils: Pupils are equal, round, and reactive to light.  Neck:     Musculoskeletal: Normal range of motion and neck supple.     Vascular: No carotid bruit.  Cardiovascular:     Rate and Rhythm: Normal rate and regular rhythm.     Pulses: Normal pulses.     Heart sounds: Normal heart sounds.  Pulmonary:     Effort: No respiratory distress.     Breath sounds: Normal breath sounds.  Abdominal:     General: Bowel sounds are normal.     Palpations: Abdomen is soft.     Tenderness: There is no abdominal tenderness. There is no guarding.  Musculoskeletal: Normal range of motion.  Lymphadenopathy:     Head:     Right side of head: No submandibular adenopathy.     Left side of  head: No submandibular adenopathy.     Cervical: No cervical adenopathy.  Skin:    General: Skin is warm and dry.     Findings: Erythema and rash present.     Comments: Patient has a red rash with hives on the abdomen, the back, and the upper extremities. There are a few red raised areas of the lower leg and ankles.  Neurological:     Mental Status: She is alert and oriented to person, place, and time.     Cranial Nerves: No cranial nerve deficit.     Sensory: No sensory deficit.  Psychiatric:        Speech: Speech normal.      ED Treatments / Results  Labs (all labs ordered are listed, but only abnormal results are displayed) Labs Reviewed - No data to display  EKG None  Radiology No results found.  Procedures Procedures (including critical care time)  Medications Ordered in ED Medications - No data to display   Initial Impression / Assessment and Plan / ED Course  I have reviewed the triage vital signs and the nursing notes.  Pertinent labs & imaging results that were available during my care of the patient were reviewed by me and considered in my medical decision making (see chart for details).          Final Clinical Impressions(s) / ED Diagnoses MDM  Vital signs reviewed.  Pulse oximetry is 98% on room air.  Within normal limits by my interpretation.  Patient has hives with red rash multiple sites.  The airway is patent.  The speech is clear and understandable.  IV Benadryl and Pepcid ordered for the patient.  Recheck.  Rash is almost completely resolved.  Patient says she feels as though she is breathing much easier.  Patient speaks in complete sentences without problem.  There is symmetrical rise and fall of the chest.  I have asked the patient to speak with her primary  physician about having an allergy test.  I have asked her to keep Benadryl close by and to return to the emergency department if there should be any recurrence of this problem.  Patient  and parent are in agreement with this plan   Final diagnoses:  Allergic reaction, initial encounter    ED Discharge Orders    None       Lily Kocher, PA-C 01/11/19 2202    Lucrezia Starch, MD 01/12/19 [phone]

## 2019-01-24 MED FILL — AIMOVIG 140 MG/ML SOAJ: 140 | 28 days supply | Qty: 1 | Fill #3

## 2019-01-27 MED FILL — FLUoxetine HCL 20 MG CAPS: 20 | 90 days supply | Qty: 270 | Fill #2

## 2019-02-12 MED FILL — NORETHINDRONE 0.35 MG TAB: 0.35 | 84 days supply | Qty: 84 | Fill #1

## 2019-02-12 MED FILL — PROPRANOLOL 40 MG TABLET: 40 | 90 days supply | Qty: 180 | Fill #1

## 2019-03-11 MED FILL — QUETIAPINE FUMARATE 50 MG T: 50 | 90 days supply | Qty: 180 | Fill #1

## 2019-03-12 MED FILL — PREGABALIN 100 MG CAPS: 100 | 90 days supply | Qty: 270 | Fill #0

## 2019-03-12 MED FILL — AMANTADINE 100 MG TABLET: 100 | 90 days supply | Qty: 180 | Fill #2

## 2019-03-27 ENCOUNTER — Ambulatory Visit: Payer: No Typology Code available for payment source | Admitting: Neurology

## 2019-03-29 MED FILL — QUETIAPINE FUMARATE 100 MG: 100 | 30 days supply | Qty: 30 | Fill #0

## 2019-04-30 MED FILL — NORETHINDRONE 0.35 MG TAB: 0.35 | 84 days supply | Qty: 84 | Fill #2

## 2019-04-30 MED FILL — QUETIAPINE FUMARATE 100 MG: 100 | 30 days supply | Qty: 30 | Fill #1

## 2019-05-06 MED FILL — PROPRANOLOL 40 MG TABLET: 40 | 90 days supply | Qty: 180 | Fill #2

## 2019-05-08 MED FILL — FLUoxetine HCL 20 MG CAPS: 20 | 90 days supply | Qty: 270 | Fill #0

## 2019-05-21 NOTE — Progress Notes (Signed)
PATIENT: Jennifer [name] DOB: 05/07/1990  REASON FOR VISIT: follow up HISTORY FROM: patient  HISTORY OF PRESENT ILLNESS: Today 05/22/19  Jennifer [name] is a 29 year old female with history of tremor and headaches.  She stopped taking Aimovig, after about 6 months, felt that it lost benefit.  She complains of about 3 migraines a week, did not feel Maxalt was not helpful.  Headaches are usually unilateral, generally on the left side, positive photophobia, phonophobia.  Denies nausea.  She will take Advil or Tylenol, with moderate benefit.  She is on progesterone birth control.  No longer thinks her headaches are worse around the time of her period.  She is also taking propanolol, Lyrica, Prozac, and Seroquel.  She previously did not tolerate Topamax due to cognitive side effect.  She does not wish to try another injectable.  She has history of tremor, is taking amantadine, filled from PCP.  She presents today accompanied by her mom.  HISTORY 09/21/2018 SS: Jennifer [name] is a 29 year old female with history of tremor and headaches.  In the past, she has been unable to tolerate Topamax.  At her last visit she was started on Aimovig.  She remains on propanolol, Seroquel, and Lyrica, all that may help migraine.  She takes Maxalt as needed. The Aimovig has been beneficial for migraines, about 50%. She reports continued worsening headaches before and during her period. The Maxalt is helpful for headache. She is taking all 10 tablets monthly. She remains on progesterone only birth control. She presents today for follow-up accompanied by her mother. When not around the the time of her headaches, she may have 1-2 headaches a week.   REVIEW OF SYSTEMS: Out of a complete 14 system review of symptoms, the patient complains only of the following symptoms, and all other reviewed systems are negative.  Headache, tremor  ALLERGIES: Allergies  Allergen Reactions  . Ambien [Zolpidem Tartrate]     hallucinations  .  Cogentin [Benztropine]     Rash, itching, dry mouth  . Topamax [Topiramate]     Cognitive side effects    HOME MEDICATIONS: Outpatient Medications Prior to Visit  Medication Sig Dispense Refill  . amantadine (SYMMETREL) 100 MG capsule Take 1 capsule (100 mg total) by mouth 2 (two) times daily. 60 capsule 0  . Amantadine HCl 100 MG tablet     . Butalbital-APAP-Caffeine 50-325-40 MG capsule TK 1 TS PO Q 8 H PRF SEVERE MIGRAINE PAIN  0  . cetirizine (ZYRTEC) 10 MG tablet Take 10 mg by mouth daily.    Marland Kitchen FLUoxetine (PROZAC) 20 MG capsule TAKE ONE CAPSULE BY MOUTH ONCE DAILY IN THE MORNING AND TWO CAPSULES AT NOON  2  . LYRICA 100 MG capsule One by mouth 3 times a day (Patient taking differently: Take 100 mg by mouth 3 (three) times daily. ) 93 capsule 5  . MELATONIN PO Take 10 mg by mouth as needed.     . meloxicam (MOBIC) 15 MG tablet     . Multiple Vitamin (MULTIVITAMIN PO) Take by mouth.    . norethindrone (MICRONOR) 0.35 MG tablet TAKE 1 TABLET BY MOUTH DAILY 84 tablet 3  . PROAIR HFA 108 (90 BASE) MCG/ACT inhaler Inhale 1 puff into the lungs every 6 (six) hours as needed for wheezing or shortness of breath.     . propranolol (INDERAL) 40 MG tablet Takes 40mg  every morning & at 2pm    . QUEtiapine (SEROQUEL) 50 MG tablet Take 1 tablet (50 mg  total) by mouth at bedtime. (Patient taking differently: Take 100 mg by mouth at bedtime. 50 mg midday) 30 tablet 0  . methocarbamol (ROBAXIN) 750 MG tablet as needed.    . rizatriptan (MAXALT) 10 MG tablet Take 1 tablet (10 mg total) by mouth 3 (three) times daily as needed for migraine. 10 tablet 3  . Erenumab-aooe (AIMOVIG) 140 MG/ML SOAJ Inject 140 mg into the skin every 30 (thirty) days. (Patient not taking: Reported on 05/22/2019) 1 pen 4   No facility-administered medications prior to visit.    PAST MEDICAL HISTORY: Past Medical History:  Diagnosis Date  . Asthma   . Common migraine with intractable migraine 06/22/2018  . Disturbance of skin  sensation 12/04/2013  . Environmental allergies    seasonal  . Fibromyalgia   . Memory loss   . Migraines    no aura  . Occasional tremors   . Psychosis ([place])   . Schizophrenia ([place])   . Secondary parkinsonism ([place]) 09/23/2014    PAST SURGICAL HISTORY: History reviewed. No pertinent surgical history.  FAMILY HISTORY: Family History  Problem Relation Age of Onset  . Breast cancer Mother        BRAC negative  age 89  . Diabetes Father   . Hypertension Father   . Pulmonary embolism Father   . Thyroid disease Maternal Aunt   . Breast cancer Maternal Aunt 19       BRAC negative  . Thyroid disease Maternal Grandmother   . Alzheimer's disease Maternal Grandfather   . Lung cancer Paternal Grandmother   . Depression Paternal Grandmother   . Heart attack Paternal Grandfather   . Depression Paternal Grandfather     SOCIAL HISTORY: Social History   Socioeconomic History  . Marital status: Single    Spouse name: Not on file  . Number of children: 0  . Years of education: college/st  . Highest education level: Not on file  Occupational History  . Occupation: Lexicographer: UNEMPLOYED  Tobacco Use  . Smoking status: Never Smoker  . Smokeless tobacco: Never Used  Substance and Sexual Activity  . Alcohol use: No  . Drug use: No  . Sexual activity: Never    Partners: Male    Birth control/protection: Pill    Comment: never sexually active  Other Topics Concern  . Not on file  Social History Narrative   Patient is right handed.   Patient does not drink caffeine.   Patient lives at home with family.   Social Determinants of Health   Financial Resource Strain:   . Difficulty of Paying Living Expenses: Not on file  Food Insecurity:   . Worried About Charity fundraiser in the Last Year: Not on file  . Ran Out of Food in the Last Year: Not on file  Transportation Needs:   . Lack of Transportation (Medical): Not on file  . Lack of Transportation (Non-Medical): Not  on file  Physical Activity:   . Days of Exercise per Week: Not on file  . Minutes of Exercise per Session: Not on file  Stress:   . Feeling of Stress : Not on file  Social Connections:   . Frequency of Communication with Friends and Family: Not on file  . Frequency of Social Gatherings with Friends and Family: Not on file  . Attends Religious Services: Not on file  . Active Member of Clubs or Organizations: Not on file  . Attends Archivist Meetings: Not  on file  . Marital Status: Not on file  Intimate Partner Violence:   . Fear of Current or Ex-Partner: Not on file  . Emotionally Abused: Not on file  . Physically Abused: Not on file  . Sexually Abused: Not on file   PHYSICAL EXAM  Vitals:   05/22/19 0751  BP: 120/70  Pulse: (!) 107  Temp: (!) 96.9 F (36.1 C)  TempSrc: Oral  Weight: 202 lb 12.8 oz (92 kg)  Height: 5' 4.75" (1.645 m)   Body mass index is 34.01 kg/m.  Generalized: Well developed, in no acute distress   Neurological examination  Mentation: Alert oriented to time, place, most of history is provided by her mother. Follows all commands speech and language fluent Cranial nerve II-XII: Pupils were equal round reactive to light. Extraocular movements were full, visual field were full on confrontational test. Facial sensation and strength were normal. Head turning and shoulder shrug were normal and symmetric. Motor: The motor testing reveals 5 over 5 strength of all 4 extremities. Good symmetric motor tone is noted throughout.  Intention tremor noted equal in the right and left hand, moderate. Sensory: Sensory testing is intact to soft touch on all 4 extremities. No evidence of extinction is noted.  Coordination: Cerebellar testing reveals good finger-nose-finger and heel-to-shin bilaterally.  Gait and station: Gait is normal. Tandem gait is normal. Romberg is negative. No drift is seen.  Reflexes: Deep tendon reflexes are symmetric but somewhat brisk in  biceps, triceps, patellar  DIAGNOSTIC DATA (LABS, IMAGING, TESTING) - I reviewed patient records, labs, notes, testing and imaging myself where available.  Lab Results  Component Value Date   WBC 10.4 02/22/2016   HGB 14.1 02/22/2016   HCT 42.5 02/22/2016   MCV 89.7 02/22/2016   PLT 283 02/22/2016      Component Value Date/Time   NA 135 02/22/2016 2332   K 3.9 02/22/2016 2332   CL 105 02/22/2016 2332   CO2 23 02/22/2016 2332   GLUCOSE 116 (H) 02/22/2016 2332   BUN 17 02/22/2016 2332   CREATININE 0.61 02/22/2016 2332   CALCIUM 9.2 02/22/2016 2332   PROT 7.7 02/22/2016 2332   ALBUMIN 3.6 02/22/2016 2332   AST 17 02/22/2016 2332   ALT 20 02/22/2016 2332   ALKPHOS 75 02/22/2016 2332   BILITOT 0.3 02/22/2016 2332   GFRNONAA >60 02/22/2016 2332   GFRAA >60 02/22/2016 2332   Lab Results  Component Value Date   CHOL 229 (H) 07/12/2015   HDL 52 07/12/2015   LDLCALC 155 (H) 07/12/2015   TRIG 111 07/12/2015   CHOLHDL 4.4 07/12/2015   Lab Results  Component Value Date   HGBA1C 5.4 07/12/2015   Lab Results  Component Value Date   VITAMINB12 719 01/16/2013   Lab Results  Component Value Date   TSH 1.670 07/12/2015      ASSESSMENT AND PLAN 29 y.o. 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 here with:  1.  Chronic migraine headache 2.  History of psychosis, secondary parkinsonism  She reports migraine headache about 3/week.  She no longer wishes to be on Aimovig.  She will stop Aimovig.  I will start Zonegran 50 mg at bedtime, she will let me know in 2 weeks how she is doing, we can increase the dose.  She  previously cannot tolerate Topamax due to cognitive side effect.  She is already taking  propanolol, Seroquel, Lyrica, and Prozac.  I did not offer nortriptyline due to potential interaction with Seroquel.  She will stop Maxalt, due to poor benefit.  She will try Imitrex 50 mg tablet as needed.  I will refill Robaxin for her to take along with Imitrex if needed for significant headache.  She will follow-up here in 6 months or sooner if needed, I will have her see Dr. Anne Hahn, she has history of psychosis, secondary parkinsonism, prescriptions for amantadine have been filled from PCP.   I spent 15 minutes with the patient. 50% of this time was spent discussing her plan of care.  Margie Ege, AGNP-C, DNP 05/22/2019, 8:37 AM [facility] [address], [address] [place], [place] [address] [phone]

## 2019-05-22 ENCOUNTER — Ambulatory Visit: Payer: No Typology Code available for payment source | Admitting: Neurology

## 2019-05-22 ENCOUNTER — Encounter: Payer: Self-pay | Admitting: Neurology

## 2019-05-22 ENCOUNTER — Other Ambulatory Visit: Payer: Self-pay

## 2019-05-22 VITALS — BP 120/70 | HR 107 | Temp 96.9°F | Ht 64.75 in | Wt 202.8 lb

## 2019-05-22 DIAGNOSIS — G43019 Migraine without aura, intractable, without status migrainosus: Secondary | ICD-10-CM

## 2019-05-22 MED ORDER — ZONISAMIDE 50 MG PO CAPS: 50.0000 mg | ORAL_CAPSULE | Freq: Every day | ORAL | 5 refills | Status: DC

## 2019-05-22 MED ORDER — SUMATRIPTAN SUCCINATE 50 MG PO TABS: 50.0000 mg | ORAL_TABLET | ORAL | 5 refills | Status: DC | PRN

## 2019-05-22 MED ORDER — METHOCARBAMOL 750 MG PO TABS: 750.0000 mg | ORAL_TABLET | Freq: Three times a day (TID) | ORAL | 1 refills | Status: DC | PRN

## 2019-05-22 MED FILL — SUMATRIPTAN SUCC 50 MG TAB: 50 | 30 days supply | Qty: 18 | Fill #0

## 2019-05-22 MED FILL — ZONISAMIDE 50 MG CAPSULE: 50 | 30 days supply | Qty: 30 | Fill #0

## 2019-05-22 MED FILL — METHOCARBAMOL 750 MG TABS: 750 | 10 days supply | Qty: 30 | Fill #0

## 2019-05-22 NOTE — Patient Instructions (Addendum)
1. Stop Aimovig  2. Start Zonegran 50 mg at bedtime, let me know in about 2 weeks how you are doing, we could either increase the dose of go to twice daily dosing  3. Stop Maxalt, try Imitrex for acute headache, may combine with Robaxin if needed,  4. See you back in 6 months   Zonisamide capsules What is this medicine? ZONISAMIDE (zoe NIS a mide) is used to control partial seizures in adults with epilepsy. This medicine may be used for other purposes; ask your health care provider or pharmacist if you have questions. COMMON BRAND NAME(S): Zonegran What should I tell my health care provider before I take this medicine? They need to know if you have any of these conditions:  kidney disease  liver disease  low level of bicarbonate in the blood  lung or breathing disease, like asthma  suicidal thoughts, plans, or attempt; a previous suicide attempt by you or a family member  an unusual or allergic reaction to zonisamide, sulfa drugs, other medicines, foods, dyes, or preservatives  pregnant or trying to get pregnant  breast-feeding How should I use this medicine? Take this medicine by mouth with a glass of water. Follow the directions on the prescription label. Do not cut, crush or chew this medicine. Swallow the capsules whole. You can take this medicine with or without food. If it upsets your stomach, take it with food. Take your medicine at regular intervals. Do not take it more often than directed. Do not stop taking except on your doctor's advice. A special MedGuide will be given to you by the pharmacist with each prescription and refill. Be sure to read this information carefully each time. Talk to your pediatrician regarding the use of this medicine in children. While this drug may be prescribed for children as young as 16 years for selected conditions, precautions do apply. Overdosage: If you think you have taken too much of this medicine contact a poison control center or  emergency room at once. NOTE: This medicine is only for you. Do not share this medicine with others. What if I miss a dose? If you miss a dose, take it as soon as you can. If it is almost time for your next dose, take only that dose. Do not take double or extra doses. What may interact with this medicine? This medicine may interact with the following medications:  acetazolamide  antihistamines for allergy, cough, and cold  atropine  certain medicines for anxiety or sleep  certain medicines for bladder problems like oxybutynin, tolterodine  certain medicines for depression like amitriptyline, fluoxetine, sertraline  certain medicines for seizures like carbamazepine, phenobarbital, phenytoin, primidone, topiramate, valproic acid  certain medicines for stomach problems like dicyclomine, hyoscyamine  certain medicines for travel sickness like scopolamine  certain medicines for Parkinson's disease like benztropine, trihexyphenidyl  dichlorphenamide  general anesthetics like halothane, isoflurane, methoxyflurane, propofol  ipratropium  medicines that relax muscles for surgery  narcotic medicines for pain  phenothiazines like chlorpromazine, mesoridazine, prochlorperazine, thioridazine  rifampin This list may not describe all possible interactions. Give your health care provider a list of all the medicines, herbs, non-prescription drugs, or dietary supplements you use. Also tell them if you smoke, drink alcohol, or use illegal drugs. Some items may interact with your medicine. What should I watch for while using this medicine? Visit your health care professional for regular checks on your progress. Tell your health care professional if your symptoms do not start to get better or if  they get worse. Wear a medical ID bracelet or chain. Carry a card that describes your disease and details of your medicine and dosage times. Do not stop taking except on your health care professional's  advice. You may develop a severe reaction. Your health care professional will tell you how much medicine to take. You may get drowsy or dizzy. Do not drive, use machinery, or do anything that needs mental alertness until you know how this medicine affects you. Do not stand up or sit up quickly, especially if you are an older patient. This reduces the risk of dizzy or fainting spells. Alcohol may interfere with the effect of this medicine. Avoid alcoholic drinks. Tell your health care professional right away if you have any change in your eyesight. This medicine can reduce the response of your body to heat or cold. Dress warm in cold weather and stay hydrated in hot weather. If possible, avoid extreme temperatures like saunas, hot tubs, very hot or cold showers, or activities that can cause dehydration such as vigorous exercise. This medicine may cause serious skin reactions. They can happen weeks to months after starting the medicine. Contact your health care provider right away if you notice fevers or flu-like symptoms with a rash. The rash may be red or purple and then turn into blisters or peeling of the skin. Or, you might notice a red rash with swelling of the face, lips or lymph nodes in your neck or under your arms. If you or your family notice any changes in your behavior, such as new or worsening depression, thoughts of harming yourself, anxiety, other unusual or disturbing thoughts, or memory loss, call your health care professional right away. What side effects may I notice from receiving this medicine? Side effects that you should report to your doctor or health care professional as soon as possible:  allergic reactions like skin rash, itching or hives, swelling of the face, lips, or tongue  blood in the urine  changes in vision  confusion  fever  hallucinations  loss of appetite  loss of memory  pain in the lower back or side  pain when urinating  rash, fever, and swollen  lymph nodes  redness, blistering, peeling or loosening of the skin, including inside the mouth  signs and symptoms of increased acid in the body like breathing fast; fast heartbeat; headache; confusion; unusually weak or tired; nausea, vomiting  signs and symptoms of low red blood cells or anemia such as unusually weak or tired; feeling faint or lightheaded; falls; breathing problems  suicidal thoughts, mood changes  unusual sweating Side effects that usually do not require medical attention (report to your doctor or health care professional if they continue or are bothersome):  dizziness  headache  irritable  nausea  tiredness  trouble sleeping This list may not describe all possible side effects. Call your doctor for medical advice about side effects. You may report side effects to FDA at 1-800-FDA-1088. Where should I keep my medicine? Keep out of reach of children. Store at room temperature between 15 and 30 degrees C (59 and 86 degrees F). Keep in a dry place protected from light. Throw away any unused medicine after the expiration date. NOTE: This sheet is a summary. It may not cover all possible information. If you have questions about this medicine, talk to your doctor, pharmacist, or health care provider.  2020 Elsevier/Gold Standard (2018-07-06 15:13:05)

## 2019-05-24 NOTE — Progress Notes (Signed)
I have read the note, and I agree with the clinical assessment and plan.  Shey Yott K Devra Stare   

## 2019-05-28 ENCOUNTER — Encounter: Payer: Self-pay | Admitting: Certified Nurse Midwife

## 2019-05-30 ENCOUNTER — Encounter: Payer: Self-pay | Admitting: Certified Nurse Midwife

## 2019-05-31 MED FILL — QUETIAPINE FUMARATE 100 MG: 100 | 30 days supply | Qty: 30 | Fill #2

## 2019-06-11 MED FILL — AMANTADINE 100 MG TABLET: 100 | 90 days supply | Qty: 180 | Fill #3

## 2019-06-18 MED FILL — PREGABALIN 100 MG CAPS: 100 | 90 days supply | Qty: 270 | Fill #1

## 2019-06-18 MED FILL — ZONISAMIDE 50 MG CAPSULE: 50 | 30 days supply | Qty: 30 | Fill #1

## 2019-06-26 MED FILL — QUETIAPINE FUMARATE 100 MG: 100 | 90 days supply | Qty: 90 | Fill #0

## 2019-06-26 MED FILL — QUETIAPINE FUMARATE 50 MG T: 50 | 90 days supply | Qty: 180 | Fill #0

## 2019-08-12 MED FILL — ZONISAMIDE 50 MG CAPSULE: 50 | 30 days supply | Qty: 30 | Fill #3

## 2019-09-10 MED FILL — ZONISAMIDE 50 MG CAPSULE: 50 | 30 days supply | Qty: 30 | Fill #4

## 2019-09-11 ENCOUNTER — Other Ambulatory Visit (HOSPITAL_COMMUNITY): Payer: Self-pay | Admitting: Adult Health Nurse Practitioner

## 2019-09-11 MED FILL — AMANTADINE 100 MG TABLET: 100 | 90 days supply | Qty: 180 | Fill #0

## 2019-09-13 MED FILL — PREGABALIN 100 MG CAPS: 100 | 90 days supply | Qty: 270 | Fill #0

## 2019-09-23 MED FILL — QUETIAPINE FUMARATE 100 MG: 100 | 90 days supply | Qty: 90 | Fill #1

## 2019-10-09 ENCOUNTER — Ambulatory Visit: Payer: No Typology Code available for payment source | Admitting: Certified Nurse Midwife

## 2019-10-15 ENCOUNTER — Ambulatory Visit: Payer: No Typology Code available for payment source | Admitting: Obstetrics & Gynecology

## 2019-10-22 MED FILL — ZONISAMIDE 50 MG CAPSULE: 50 | 30 days supply | Qty: 30 | Fill #5

## 2019-11-04 MED FILL — FLUoxetine HCL 20 MG CAPS: 20 | 90 days supply | Qty: 270 | Fill #2

## 2019-11-05 ENCOUNTER — Other Ambulatory Visit: Payer: Self-pay

## 2019-11-05 ENCOUNTER — Telehealth: Payer: Self-pay

## 2019-11-05 DIAGNOSIS — N946 Dysmenorrhea, unspecified: Secondary | ICD-10-CM

## 2019-11-05 DIAGNOSIS — G43101 Migraine with aura, not intractable, with status migrainosus: Secondary | ICD-10-CM

## 2019-11-05 MED FILL — PROPRANOLOL 40 MG TABLET: 40 | 90 days supply | Qty: 180 | Fill #1

## 2019-11-05 NOTE — Telephone Encounter (Signed)
Patients mother is calling in regards to refill of Micronor.

## 2019-11-05 NOTE — Telephone Encounter (Signed)
Patients mother called to make patient AEX. Need triage to assist, patient would like to see DR. Edward Jolly.

## 2019-11-05 NOTE — Telephone Encounter (Signed)
Medication refill request: Micronor Last AEX:  10/06/18 DL Next AEX: triage to call patient and schedule with Dr. Edward Jolly Last MMG (if hormonal medication request): n/a Refill authorized: Today, please advise

## 2019-11-05 NOTE — Telephone Encounter (Signed)
Left message for pt to return call to triage RN. 

## 2019-11-06 NOTE — Telephone Encounter (Signed)
Patient's mother is returning call. Please call cell number [phone].

## 2019-11-06 NOTE — Telephone Encounter (Signed)
Spoke with pt's mother Thurston Hole per [place]. Pt needing AEX appt with Dr Edward Jolly. Pt had last AEX on 09/2018 with DL.  Pt scheduled with next available AEX appt for 12/25/19 at 4 pm. Mother agreeable and verbalized understanding. Mother states pt has disability and mother is pt's POA and will be coming with pt to appt.   Encounter closed.

## 2019-11-07 MED ORDER — NORETHINDRONE 0.35 MG PO TABS: 1.0000 | ORAL_TABLET | Freq: Every day | ORAL | 0 refills | Status: DC

## 2019-11-07 MED FILL — NORETHINDRONE 0.35 MG TABS: 0.35 | 84 days supply | Qty: 84 | Fill #0

## 2019-11-09 ENCOUNTER — Ambulatory Visit: Payer: No Typology Code available for payment source | Admitting: Obstetrics & Gynecology

## 2019-11-26 ENCOUNTER — Other Ambulatory Visit: Payer: Self-pay | Admitting: Neurology

## 2019-11-27 ENCOUNTER — Other Ambulatory Visit: Payer: Self-pay | Admitting: Neurology

## 2019-11-27 ENCOUNTER — Ambulatory Visit: Payer: No Typology Code available for payment source | Admitting: Neurology

## 2019-11-27 ENCOUNTER — Other Ambulatory Visit: Payer: Self-pay

## 2019-11-27 ENCOUNTER — Encounter: Payer: Self-pay | Admitting: Neurology

## 2019-11-27 VITALS — BP 112/73 | HR 89 | Ht 66.5 in | Wt 212.0 lb

## 2019-11-27 DIAGNOSIS — G250 Essential tremor: Secondary | ICD-10-CM | POA: Diagnosis not present

## 2019-11-27 DIAGNOSIS — G43019 Migraine without aura, intractable, without status migrainosus: Secondary | ICD-10-CM

## 2019-11-27 MED FILL — ZONISAMIDE 50 MG CAPSULE: 50 | 30 days supply | Qty: 30 | Fill #0

## 2019-11-27 NOTE — Patient Instructions (Signed)
Try dosing the propranolol in the morning and evening.

## 2019-11-27 NOTE — Progress Notes (Signed)
Reason for visit: Migraine headache, tremor  Jennifer [name] is an 29 y.o. female  History of present illness:  Jennifer [name] is a 29 year old right-handed white female with a history of schizophrenia.  The patient has a history of migraine headaches and tremors that have been a chronic issue for her.  When last seen, she was placed on Zonegran in low-dose, she takes 50 mg at night.  The patient has done quite well with the Zonegran, she is only having about 1 headache a week and she tolerates the medication well.  She did not tolerate Topamax previously.  She denies any nausea and vomiting with the headache, she claims that she can function through the headache.  At times, while having a migraine she may have some stuttering speech problems.  This does not translate into other timeframes.  The patient reports that she does have tremors that affect the arms and occasionally in the legs.  She reports that the tremors are mainly a resting type tremor and they keep her awake at night.  The patient take propranolol 40 mg twice a day, but she takes the medication in the morning and then early afternoon and not in the evening.  The patient denies taking any caffeinated products.  She comes to this office for further evaluation.  The tremor does affect the handwriting.  She denies any prior use of lithium.  Past Medical History:  Diagnosis Date  . Asthma   . Common migraine with intractable migraine 06/22/2018  . Disturbance of skin sensation 12/04/2013  . Environmental allergies    seasonal  . Fibromyalgia   . Memory loss   . Migraines    no aura  . Occasional tremors   . Psychosis (HCC)   . Schizophrenia (HCC)   . 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  Family History  Problem Relation Age of Onset  . Breast cancer Mother        BRAC negative  age 1  . Diabetes Father   . Hypertension Father   . Pulmonary embolism Father   . Thyroid disease Maternal  Aunt   . Breast cancer Maternal Aunt 45       BRAC negative  . Thyroid disease Maternal Grandmother   . Alzheimer's disease Maternal Grandfather   . Lung cancer Paternal Grandmother   . Depression Paternal Grandmother   . Heart attack Paternal Grandfather   . Depression Paternal Grandfather     Social history:  reports that she has never smoked. She has never used smokeless tobacco. She reports that she does not drink alcohol and does not use drugs.    Allergies  Allergen Reactions  . Ambien [Zolpidem Tartrate]     hallucinations  . Cogentin [Benztropine]     Rash, itching, dry mouth  . Topamax [Topiramate]     Cognitive side effects    Medications:  Prior to Admission medications   Medication Sig Start Date End Date Taking? Authorizing Provider  amantadine (SYMMETREL) 100 MG capsule Take 1 capsule (100 mg total) by mouth 2 (two) times daily. 07/14/15   Oneta Rack, NP  Amantadine HCl 100 MG tablet  12/04/18   [provider]  Butalbital-APAP-Caffeine 50-325-40 MG capsule TK 1 TS PO Q 8 H PRF SEVERE MIGRAINE PAIN 01/09/18   [provider]  cetirizine (ZYRTEC) 10 MG tablet Take 10 mg by mouth daily.    [provider]  FLUoxetine (PROZAC) 20  MG capsule TAKE ONE CAPSULE BY MOUTH ONCE DAILY IN THE MORNING AND TWO CAPSULES AT NOON 08/24/15   [provider]  LYRICA 100 MG capsule One by mouth 3 times a day Patient taking differently: Take 100 mg by mouth 3 (three) times daily.  01/09/13   Deetta Perla, MD  MELATONIN PO Take 10 mg by mouth as needed.     [provider]  meloxicam (MOBIC) 15 MG tablet  09/26/18   [provider]  methocarbamol (ROBAXIN) 750 MG tablet Take 1 tablet (750 mg total) by mouth every 8 (eight) hours as needed (as needed for headache). 05/22/19   Glean Salvo, NP  Multiple Vitamin (MULTIVITAMIN PO) Take by mouth.    [provider]  norethindrone (MICRONOR) 0.35 MG tablet Take 1 tablet  (0.35 mg total) by mouth daily. 11/07/19   Amundson Shirley Friar, MD  PROAIR HFA 108 (90 BASE) MCG/ACT inhaler Inhale 1 puff into the lungs every 6 (six) hours as needed for wheezing or shortness of breath.  07/22/14   [provider]  propranolol (INDERAL) 40 MG tablet Takes 40mg  every morning & at 2pm 05/11/18   [provider]  QUEtiapine (SEROQUEL) 50 MG tablet Take 1 tablet (50 mg total) by mouth at bedtime. Patient taking differently: Take 100 mg by mouth at bedtime. 50 mg midday 07/14/15   09/13/15, NP  SUMAtriptan (IMITREX) 50 MG tablet Take 1 tablet (50 mg total) by mouth every 2 (two) hours as needed for migraine. May repeat in 2 hours if headache persists or recurs. 05/22/19   07/22/19, NP  zonisamide (ZONEGRAN) 50 MG capsule Take 1 capsule (50 mg total) by mouth at bedtime. 05/22/19   07/22/19, NP    ROS:  Out of a complete 14 system review of symptoms, the patient complains only of the following symptoms, and all other reviewed systems are negative.  Tremor Headache Insomnia  Blood pressure 112/73, pulse 89, height 5' 6.5" (1.689 m), weight 212 lb (96.2 kg).  Physical Exam  General: The patient is alert and cooperative at the time of the examination.  The patient is moderately obese.  Skin: No significant peripheral edema is noted.   Neurologic Exam  Mental status: The patient is alert and oriented x 3 at the time of the examination. The patient has apparent normal recent and remote memory, with an apparently normal attention span and concentration ability.   Cranial nerves: Facial symmetry is present. Speech is normal, no aphasia or dysarthria is noted. Extraocular movements are full. Visual fields are full.  Motor: The patient has good strength in all 4 extremities.  Sensory examination: Soft touch sensation is symmetric on the face, arms, and legs.  Coordination: The patient has good finger-nose-finger and heel-to-shin bilaterally.   The patient does have some action type tremor with finger-nose-finger.  Minimal tremor is translated into handwriting when drawing a spiral.  Gait and station: The patient has a normal gait. Tandem gait is slightly unsteady. Romberg is negative. No drift is seen.  Reflexes: Deep tendon reflexes are symmetric, but are somewhat brisk in the lower extremities.   Assessment/Plan:  1.  History of schizophrenia  2.  Migraine headache  3.  Tremor  The patient reports mainly a resting type tremor, but upon observation the tremor appears to be mainly an action type tremor.  The patient claims that the tremor is keeping her awake at night.  The patient  is on propranolol but she doses in the morning and early afternoon, I have recommended that she take the first dose in the morning and the second dose in the evening to see if this helps the tremor later on in the day.  If need be, the propranolol can be dosed 3 times daily.  The patient will remain on Zonegran 50 mg at night, this has improved her migraine significantly.  She takes Imitrex if needed.  The patient will follow up here in 6 months.  Marlan Palau MD 11/27/2019 8:21 AM  [facility] [address] [place], [place] [address]  Phone [phone] Fax [phone]

## 2019-12-03 MED FILL — QUETIAPINE FUMARATE 50 MG T: 50 | 90 days supply | Qty: 180 | Fill #1

## 2019-12-06 MED FILL — QUETIAPINE FUMARATE 100 MG: 100 | 90 days supply | Qty: 90 | Fill #0

## 2019-12-11 ENCOUNTER — Other Ambulatory Visit (HOSPITAL_COMMUNITY): Payer: Self-pay | Admitting: Adult Health Nurse Practitioner

## 2019-12-17 MED FILL — AMANTADINE 100 MG TABLET: 100 | 90 days supply | Qty: 180 | Fill #1

## 2019-12-17 MED FILL — PREGABALIN 100 MG CAPS: 100 | 90 days supply | Qty: 270 | Fill #1

## 2019-12-21 MED FILL — ZONISAMIDE 50 MG CAPSULE: 50 | 30 days supply | Qty: 30 | Fill #1

## 2019-12-25 ENCOUNTER — Other Ambulatory Visit: Payer: Self-pay | Admitting: Obstetrics and Gynecology

## 2019-12-25 ENCOUNTER — Ambulatory Visit (INDEPENDENT_AMBULATORY_CARE_PROVIDER_SITE_OTHER): Payer: No Typology Code available for payment source | Admitting: Obstetrics and Gynecology

## 2019-12-25 ENCOUNTER — Other Ambulatory Visit: Payer: Self-pay

## 2019-12-25 ENCOUNTER — Encounter: Payer: Self-pay | Admitting: Obstetrics and Gynecology

## 2019-12-25 VITALS — BP 110/68 | HR 98 | Ht 64.5 in | Wt 212.0 lb

## 2019-12-25 DIAGNOSIS — Z0000 Encounter for general adult medical examination without abnormal findings: Secondary | ICD-10-CM | POA: Diagnosis not present

## 2019-12-25 DIAGNOSIS — N946 Dysmenorrhea, unspecified: Secondary | ICD-10-CM | POA: Diagnosis not present

## 2019-12-25 DIAGNOSIS — G43101 Migraine with aura, not intractable, with status migrainosus: Secondary | ICD-10-CM | POA: Diagnosis not present

## 2019-12-25 MED ORDER — NORETHINDRONE 0.35 MG PO TABS: 1.0000 | ORAL_TABLET | Freq: Every day | ORAL | 3 refills | Status: DC

## 2019-12-25 NOTE — Progress Notes (Signed)
29 y.o. G0P0000 Single Caucasian female here for annual exam.    Mother present for the visit today.   Patient having some irregular bleeding with POPs. Spotting occurs about 3 days per month.  Is usually relatively on time with her pills. She is taking her POPs to assist with regulating her periods and regulating her emotions.  She has some PMS symptoms the week before her period. Not sexually active.   Having less migraines since stopping combined oral contraception.   Patient complaining of left sided back pain--mid. She feels it is related to her tremors. Denies any urinary symptoms, abdominal discomfort or fever. Mother is stating she is having back spasms and that her essential tremor may contribute to this.  Denies dysuria.   Patient also reporting discomfort in her hands.  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  Received her Covid vaccine, [employer].   PCP:  Allyn Kenner, MD   Patient's last menstrual period was 12/11/2019 (approximate).     Period Pattern: (!) Irregular     Sexually active: No.  The current method of family planning is POPs. Exercising: Yes.    walks her pet Smoker:  no  Health Maintenance: Pap: 09-30-17 Neg, 09-20-14 Neg History of abnormal Pap:  no MMG: 09-23-14 Lt.Br.US/Neg/BiRads1 Colonoscopy:  n/a BMD:   n/a  Result  n/a TDaP:  09-28-16 Gardasil: 03-15-09--unsure if completed HIV:no Hep C:no Screening Labs:  PCP.    reports that she has never smoked. She has never used smokeless tobacco. She reports that she does not drink alcohol and does not use drugs.  Past Medical History:  Diagnosis Date  . Asthma   . Common migraine with intractable migraine 06/22/2018  . Disturbance of skin sensation 12/04/2013  . Environmental allergies    seasonal  . Fibromyalgia   . Memory loss   . Migraines    with aura  . Occasional tremors   . Psychosis ([place])   . Schizophrenia ([place])   . Secondary parkinsonism ([place]) 09/23/2014    History reviewed. No  pertinent surgical history.  Current Outpatient Medications  Medication Sig Dispense Refill  . amantadine (SYMMETREL) 100 MG capsule Take 1 capsule (100 mg total) by mouth 2 (two) times daily. 60 capsule 0  . cetirizine (ZYRTEC) 10 MG tablet Take 10 mg by mouth daily.    Marland Kitchen FLUoxetine (PROZAC) 20 MG capsule TAKE ONE CAPSULE BY MOUTH ONCE DAILY IN THE MORNING AND TWO CAPSULES AT NOON  2  . LYRICA 100 MG capsule One by mouth 3 times a day (Patient taking differently: Take 100 mg by mouth 3 (three) times daily. ) 93 capsule 5  . MELATONIN PO Take 10 mg by mouth as needed.     . methocarbamol (ROBAXIN) 750 MG tablet Take 1 tablet (750 mg total) by mouth every 8 (eight) hours as needed (as needed for headache). 30 tablet 1  . Multiple Vitamin (MULTIVITAMIN PO) Take by mouth.    . norethindrone (MICRONOR) 0.35 MG tablet Take 1 tablet (0.35 mg total) by mouth daily. 84 tablet 0  . PROAIR HFA 108 (90 BASE) MCG/ACT inhaler Inhale 1 puff into the lungs every 6 (six) hours as needed for wheezing or shortness of breath.     . propranolol (INDERAL) 40 MG tablet Takes 65m every morning & at 2pm    . QUEtiapine (SEROQUEL) 100 MG tablet Take 100 mg by mouth at bedtime.    . SUMAtriptan (IMITREX) 50 MG tablet Take 1 tablet (  50 mg total) by mouth every 2 (two) hours as needed for migraine. May repeat in 2 hours if headache persists or recurs. 10 tablet 5  . zonisamide (ZONEGRAN) 50 MG capsule TAKE 1 CAPSULE BY MOUTH EVERY NIGHT AT BEDTIME 30 capsule 5   No current facility-administered medications for this visit.    Family History  Problem Relation Age of Onset  . Breast cancer Mother        BRAC negative  age 69  . Diabetes Father   . Hypertension Father   . Pulmonary embolism Father   . Thyroid disease Maternal Aunt   . Breast cancer Maternal Aunt 39       BRAC negative  . Thyroid disease Maternal Grandmother   . Alzheimer's disease Maternal Grandfather   . Lung cancer Paternal Grandmother   .  Depression Paternal Grandmother   . Heart attack Paternal Grandfather   . Depression Paternal Grandfather     Review of Systems  All other systems reviewed and are negative.   Exam:   BP 110/68   Pulse 98   Ht 5' 4.5" (1.638 m)   Wt 212 lb (96.2 kg)   LMP 12/11/2019 (Approximate)   SpO2 99%   BMI 35.83 kg/m     General appearance: alert, cooperative and appears stated age Head: normocephalic, without obvious abnormality, atraumatic Neck: no adenopathy, supple, symmetrical, trachea midline and thyroid normal to inspection and palpation Lungs: clear to auscultation bilaterally Breasts: normal appearance, no masses or tenderness, No nipple retraction or dimpling, No nipple discharge or bleeding, No axillary adenopathy Heart: regular rate and rhythm Abdomen: soft, non-tender; no masses, no organomegaly Extremities: extremities normal, atraumatic, no cyanosis or edema Skin: skin color, texture, turgor normal. No rashes or lesions Lymph nodes: cervical, supraclavicular, and axillary nodes normal. Neurologic: grossly normal  Pelvic: External genitalia:  no lesions              No abnormal inguinal nodes palpated.              Urethra:  normal appearing urethra with no masses, tenderness or lesions              Bartholins and Skenes: normal                Chaperone was present for exam.  Assessment:   Well woman visit with normal exam. Migraine with aura.  FH of breast cancer.  Mother negative for BRCA. Breakthrough bleeding on Micronor.   Schizophrenia with secondary parkinson's.  Plan: Mammogram screening age 28.  Self breast awareness reviewed. Pap and HR HPV as above. Guidelines for Calcium, Vitamin D, regular exercise program including cardiovascular and weight bearing exercise. Continue Micronor.  She will follow up with her neurologist for her muscle spasms and hand pain.   Follow up annually and prn.   After visit summary provided.

## 2019-12-25 NOTE — Patient Instructions (Signed)

## 2020-01-21 MED FILL — ZONISAMIDE 50 MG CAPSULE: 50 | 30 days supply | Qty: 30 | Fill #2

## 2020-01-21 MED FILL — NORETHINDRONE 0.35 MG TABS: 0.35 | 84 days supply | Qty: 84 | Fill #0

## 2020-01-22 MED FILL — SUMATRIPTAN SUCC 50 MG TAB: 50 | 30 days supply | Qty: 18 | Fill #1

## 2020-02-06 ENCOUNTER — Other Ambulatory Visit (HOSPITAL_COMMUNITY): Payer: Self-pay | Admitting: Adult Health Nurse Practitioner

## 2020-02-06 MED FILL — FLUoxetine HCL 20 MG CAPS: 20 | 90 days supply | Qty: 270 | Fill #0

## 2020-02-18 MED FILL — PROPRANOLOL 40 MG TABLET: 40 | 90 days supply | Qty: 180 | Fill #2

## 2020-02-18 MED FILL — ZONISAMIDE 50 MG CAPSULE: 50 | 30 days supply | Qty: 30 | Fill #3

## 2020-02-26 MED FILL — QUETIAPINE FUMARATE 50 MG T: 50 | 90 days supply | Qty: 180 | Fill #0

## 2020-02-28 MED FILL — QUETIAPINE FUMARATE 100 MG: 100 | 90 days supply | Qty: 180 | Fill #0

## 2020-03-16 ENCOUNTER — Other Ambulatory Visit (HOSPITAL_COMMUNITY): Payer: Self-pay | Admitting: Internal Medicine

## 2020-03-17 MED FILL — PREGABALIN 100 MG CAPS: 100 | 90 days supply | Qty: 270 | Fill #0

## 2020-03-22 ENCOUNTER — Other Ambulatory Visit (HOSPITAL_COMMUNITY): Payer: Self-pay | Admitting: Internal Medicine

## 2020-03-23 MED FILL — ZONISAMIDE 50 MG CAPSULE: 50 | 30 days supply | Qty: 30 | Fill #4

## 2020-03-24 MED FILL — ALBUTEROL SULFATE HFA 108 (: 108 (90 BAS | 25 days supply | Qty: 18 | Fill #0

## 2020-04-22 MED FILL — ZONISAMIDE 50 MG CAPSULE: 50 | 30 days supply | Qty: 30 | Fill #5

## 2020-04-22 MED FILL — NORETHINDRONE 0.35 MG TABS: 0.35 | 84 days supply | Qty: 84 | Fill #1

## 2020-05-01 MED FILL — FLUoxetine HCL 20 MG CAPS: 20 | 90 days supply | Qty: 270 | Fill #1

## 2020-05-13 ENCOUNTER — Other Ambulatory Visit (HOSPITAL_COMMUNITY): Payer: Self-pay | Admitting: Adult Health Nurse Practitioner

## 2020-05-14 MED FILL — PROPRANOLOL 40 MG TABLET: 40 | 90 days supply | Qty: 180 | Fill #0

## 2020-05-16 ENCOUNTER — Other Ambulatory Visit: Payer: Self-pay | Admitting: Neurology

## 2020-05-16 MED FILL — ZONISAMIDE 50 MG CAPSULE: 50 | 30 days supply | Qty: 30 | Fill #0

## 2020-05-26 ENCOUNTER — Ambulatory Visit: Payer: No Typology Code available for payment source | Admitting: Neurology

## 2020-05-31 ENCOUNTER — Other Ambulatory Visit: Payer: Self-pay | Admitting: Neurology

## 2020-06-01 ENCOUNTER — Other Ambulatory Visit: Payer: Self-pay | Admitting: Neurology

## 2020-06-02 MED FILL — METHOCARBAMOL 750 MG TABS: 750 | 10 days supply | Qty: 30 | Fill #0

## 2020-06-02 MED FILL — SUMATRIPTAN SUCCINATE 50 MG: 50 | 30 days supply | Qty: 10 | Fill #0

## 2020-06-03 ENCOUNTER — Other Ambulatory Visit (HOSPITAL_BASED_OUTPATIENT_CLINIC_OR_DEPARTMENT_OTHER): Payer: Self-pay

## 2020-06-12 ENCOUNTER — Encounter: Payer: Self-pay | Admitting: Neurology

## 2020-06-12 ENCOUNTER — Ambulatory Visit: Payer: No Typology Code available for payment source | Admitting: Neurology

## 2020-06-12 ENCOUNTER — Other Ambulatory Visit: Payer: Self-pay | Admitting: Neurology

## 2020-06-12 VITALS — BP 116/64 | HR 74 | Ht 66.0 in | Wt 245.0 lb

## 2020-06-12 DIAGNOSIS — G43019 Migraine without aura, intractable, without status migrainosus: Secondary | ICD-10-CM | POA: Diagnosis not present

## 2020-06-12 DIAGNOSIS — G250 Essential tremor: Secondary | ICD-10-CM | POA: Diagnosis not present

## 2020-06-12 MED ORDER — ZONISAMIDE 50 MG PO CAPS: 50.0000 mg | ORAL_CAPSULE | Freq: Every day | ORAL | 3 refills | Status: DC

## 2020-06-12 MED ORDER — METHOCARBAMOL 750 MG PO TABS: ORAL_TABLET | ORAL | 3 refills | Status: DC

## 2020-06-12 MED ORDER — SUMATRIPTAN SUCCINATE 50 MG PO TABS: ORAL_TABLET | ORAL | 5 refills | Status: DC

## 2020-06-12 MED FILL — METHOCARBAMOL 750 MG TABS: 750 | 10 days supply | Qty: 30 | Fill #0

## 2020-06-12 MED FILL — ZONISAMIDE 50 MG CAPSULE: 50 | 90 days supply | Qty: 90 | Fill #0

## 2020-06-12 NOTE — Progress Notes (Signed)
I have read the note, and I agree with the clinical assessment and plan.  Sebert Stollings K Farren Landa   

## 2020-06-12 NOTE — Patient Instructions (Signed)
Continue current medications  Call for worsening headaches  See you back in 1 year  

## 2020-06-12 NOTE — Progress Notes (Signed)
PATIENT: Jennifer [name] DOB: July 09, 1990  REASON FOR VISIT: follow up HISTORY FROM: patient  HISTORY OF PRESENT ILLNESS: Today 06/12/20 Jennifer [name] is a 30 year old female with history of schizophrenia, migraine headaches, and tremor.  Headaches are much improved with Zonegran, on average 1-2 migraines a month, has excellent benefit with Imitrex, Robaxin, and ibuprofen.  Tremor is overall stable, has adjusted propanolol to taking twice daily, second dose is in the evening.  Notes tremor with eating, mostly affects the arms.  On exam, seem to be more action, she claims a resting.  At times may have some speech stuttering.  She follows with psychiatry, has been switched from amantadine to Astudeo, unfortunately has resulted in weight gain (33 lbs last 6 months).  Is not currently working or in school.  Overall, is doing well, pleased with her current status.  Here today for evaluation accompanied by her mother.  HISTORY 11/27/2019 Dr. Anne Hahn: Ms. Vandervort is a 30 year old right-handed white female with a history of schizophrenia.  The patient has a history of migraine headaches and tremors that have been a chronic issue for her.  When last seen, she was placed on Zonegran in low-dose, she takes 50 mg at night.  The patient has done quite well with the Zonegran, she is only having about 1 headache a week and she tolerates the medication well.  She did not tolerate Topamax previously.  She denies any nausea and vomiting with the headache, she claims that she can function through the headache.  At times, while having a migraine she may have some stuttering speech problems.  This does not translate into other timeframes.  The patient reports that she does have tremors that affect the arms and occasionally in the legs.  She reports that the tremors are mainly a resting type tremor and they keep her awake at night.  The patient take propranolol 40 mg twice a day, but she takes the medication in the morning  and then early afternoon and not in the evening.  The patient denies taking any caffeinated products.  She comes to this office for further evaluation.  The tremor does affect the handwriting.  She denies any prior use of lithium.   REVIEW OF SYSTEMS: Out of a complete 14 system review of symptoms, the patient complains only of the following symptoms, and all other reviewed systems are negative.  Headache, tremor  ALLERGIES: Allergies  Allergen Reactions  . Ambien [Zolpidem Tartrate]     hallucinations  . Cogentin [Benztropine]     Rash, itching, dry mouth  . Topamax [Topiramate]     Cognitive side effects    HOME MEDICATIONS: Outpatient Medications Prior to Visit  Medication Sig Dispense Refill  . cetirizine (ZYRTEC) 10 MG tablet Take 10 mg by mouth daily.    . Deutetrabenazine (AUSTEDO) 12 MG TABS Take by mouth. 6mg  bid    . FLUoxetine (PROZAC) 20 MG capsule TAKE ONE CAPSULE BY MOUTH ONCE DAILY IN THE MORNING AND TWO CAPSULES AT NOON  2  . LYRICA 100 MG capsule One by mouth 3 times a day (Patient taking differently: Take 100 mg by mouth 3 (three) times daily.) 93 capsule 5  . MELATONIN PO Take 10 mg by mouth as needed.     . Multiple Vitamin (MULTIVITAMIN PO) Take by mouth.    . norethindrone (MICRONOR) 0.35 MG tablet Take 1 tablet (0.35 mg total) by mouth daily. 84 tablet 3  . PROAIR HFA 108 (90 BASE) MCG/ACT inhaler  Inhale 1 puff into the lungs every 6 (six) hours as needed for wheezing or shortness of breath.     . propranolol (INDERAL) 40 MG tablet Takes 40mg  every morning & at 2pm    . QUEtiapine (SEROQUEL) 100 MG tablet Take 100 mg by mouth at bedtime.    . methocarbamol (ROBAXIN) 750 MG tablet TAKE 1 TABLET BY MOUTH EVERY 8 HOURS AS NEEDED FOR HEADACHE 30 tablet 0  . SUMAtriptan (IMITREX) 50 MG tablet Take 1 tab at onset of migraine.  May repeat in 2 hrs, if needed.  Max dose: 2 tabs/day. This is a 30 day prescription. 10 tablet 0  . zonisamide (ZONEGRAN) 50 MG capsule TAKE 1  CAPSULE BY MOUTH EVERY NIGHT AT BEDTIME 30 capsule 1  . amantadine (SYMMETREL) 100 MG capsule Take 1 capsule (100 mg total) by mouth 2 (two) times daily. 60 capsule 0   No facility-administered medications prior to visit.    PAST MEDICAL HISTORY: Past Medical History:  Diagnosis Date  . Asthma   . Common migraine with intractable migraine 06/22/2018  . Disturbance of skin sensation 12/04/2013  . Environmental allergies    seasonal  . Fibromyalgia   . Memory loss   . Migraines    with aura  . Occasional tremors   . Psychosis (HCC)   . Schizophrenia (HCC)   . 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  FAMILY HISTORY: Family History  Problem Relation Age of Onset  . Breast cancer Mother        BRAC negative  age 24  . Diabetes Father   . Hypertension Father   . Pulmonary embolism Father   . Thyroid disease Maternal Aunt   . Breast cancer Maternal Aunt 45       BRAC negative  . Thyroid disease Maternal Grandmother   . Alzheimer's disease Maternal Grandfather   . Lung cancer Paternal Grandmother   . Depression Paternal Grandmother   . Heart attack Paternal Grandfather   . Depression Paternal Grandfather     SOCIAL HISTORY: Social History   Socioeconomic History  . Marital status: Single    Spouse name: Not on file  . Number of children: 0  . Years of education: college/st  . Highest education level: Not on file  Occupational History  . Occupation: 56: UNEMPLOYED  Tobacco Use  . Smoking status: Never Smoker  . Smokeless tobacco: Never Used  Vaping Use  . Vaping Use: Never used  Substance and Sexual Activity  . Alcohol use: No  . Drug use: No  . Sexual activity: Never    Partners: Male    Birth control/protection: Pill    Comment: never sexually active  Other Topics Concern  . Not on file  Social History Narrative   Patient is right handed.   Patient does not drink caffeine.    Patient lives at home with family.   Social Determinants of Health   Financial Resource Strain: Not on file  Food Insecurity: Not on file  Transportation Needs: Not on file  Physical Activity: Not on file  Stress: Not on file  Social Connections: Not on file  Intimate Partner Violence: Not on file   PHYSICAL EXAM  Vitals:   06/12/20 1008  BP: 116/64  Pulse: 74  Weight: 245 lb (111.1 kg)  Height: 5\' 6"  (1.676 m)   Body mass index is 39.54 kg/m.  Generalized: Well developed, in  no acute distress  Neurological examination  Mentation: Alert oriented to time, place, most history is provided by her mother. Follows all commands speech and language fluent Cranial nerve II-XII: Pupils were equal round reactive to light. Extraocular movements were full, visual field were full on confrontational test. Facial sensation and strength were normal. Head turning and shoulder shrug  were normal and symmetric. Motor: The motor testing reveals 5 over 5 strength of all 4 extremities. Good symmetric motor tone is noted throughout.  Sensory: Sensory testing is intact to soft touch on all 4 extremities. No evidence of extinction is noted.  Coordination: Mild tremor with finger-nose-finger bilaterally, minimal tremor is translated into handwriting, more so with spiral drawl Gait and station: Gait is normal.  Reflexes: Deep tendon reflexes are symmetric but brisk in the lower extremities  DIAGNOSTIC DATA (LABS, IMAGING, TESTING) - I reviewed patient records, labs, notes, testing and imaging myself where available.  Lab Results  Component Value Date   WBC 10.4 02/22/2016   HGB 14.1 02/22/2016   HCT 42.5 02/22/2016   MCV 89.7 02/22/2016   PLT 283 02/22/2016      Component Value Date/Time   NA 135 02/22/2016 2332   K 3.9 02/22/2016 2332   CL 105 02/22/2016 2332   CO2 23 02/22/2016 2332   GLUCOSE 116 (H) 02/22/2016 2332   BUN 17 02/22/2016 2332   CREATININE 0.61 02/22/2016 2332   CALCIUM 9.2  02/22/2016 2332   PROT 7.7 02/22/2016 2332   ALBUMIN 3.6 02/22/2016 2332   AST 17 02/22/2016 2332   ALT 20 02/22/2016 2332   ALKPHOS 75 02/22/2016 2332   BILITOT 0.3 02/22/2016 2332   GFRNONAA >60 02/22/2016 2332   GFRAA >60 02/22/2016 2332   Lab Results  Component Value Date   CHOL 229 (H) 07/12/2015   HDL 52 07/12/2015   LDLCALC 155 (H) 07/12/2015   TRIG 111 07/12/2015   CHOLHDL 4.4 07/12/2015   Lab Results  Component Value Date   HGBA1C 5.4 07/12/2015   Lab Results  Component Value Date   VITAMINB12 719 01/16/2013   Lab Results  Component Value Date   TSH 1.670 07/12/2015   ASSESSMENT AND PLAN 30 y.o. 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 here with:  1.  History of schizophrenia 2.  Migraine headache 3.  Tremor  -Appears overall stable & improved, follows with psychiatric NP -Migraines under excellent control, tremor is improved -Continue Zonegran 50 mg at bedtime for migraine prevention -Continue Imitrex, Robaxin, can combine with ibuprofen for acute headache -Follow-up in 1 year or sooner if needed  I spent 20 minutes of face-to-face and non-face-to-face time with patient.  This included previsit chart review, lab review, study review, order entry, electronic health record documentation, patient education.  Margie Ege, AGNP-C, DNP 06/12/2020, 10:41 AM [facility] [address], [address] [place], [place] [address] [phone]

## 2020-06-18 ENCOUNTER — Other Ambulatory Visit (HOSPITAL_COMMUNITY): Payer: Self-pay

## 2020-06-19 ENCOUNTER — Other Ambulatory Visit (HOSPITAL_COMMUNITY): Payer: Self-pay

## 2020-06-23 ENCOUNTER — Other Ambulatory Visit (HOSPITAL_COMMUNITY): Payer: Self-pay

## 2020-06-23 MED ORDER — PREGABALIN 100 MG PO CAPS
100.0000 mg | ORAL_CAPSULE | Freq: Three times a day (TID) | ORAL | 1 refills | Status: DC
  Filled 2020-06-23 – 2020-10-02 (×2): qty 270, 90d supply, fill #0
  Filled 2020-12-21 – 2020-12-30 (×2): qty 270, 90d supply, fill #1

## 2020-06-25 ENCOUNTER — Other Ambulatory Visit (HOSPITAL_COMMUNITY): Payer: Self-pay

## 2020-07-01 ENCOUNTER — Other Ambulatory Visit (HOSPITAL_COMMUNITY): Payer: Self-pay

## 2020-07-01 MED ORDER — PREGABALIN 100 MG PO CAPS
ORAL_CAPSULE | Freq: Three times a day (TID) | ORAL | 1 refills | Status: DC
  Filled 2020-07-01 – 2020-07-07 (×2): qty 270, 90d supply, fill #0
  Filled 2020-09-21: qty 270, 90d supply, fill #1

## 2020-07-04 ENCOUNTER — Other Ambulatory Visit (HOSPITAL_COMMUNITY): Payer: Self-pay

## 2020-07-07 ENCOUNTER — Other Ambulatory Visit (HOSPITAL_COMMUNITY): Payer: Self-pay

## 2020-07-07 MED FILL — Norethindrone Tab 0.35 MG: ORAL | 84 days supply | Qty: 84 | Fill #0 | Status: AC

## 2020-07-14 ENCOUNTER — Other Ambulatory Visit (HOSPITAL_COMMUNITY): Payer: Self-pay

## 2020-07-14 MED FILL — Quetiapine Fumarate Tab 100 MG: ORAL | 90 days supply | Qty: 180 | Fill #0 | Status: AC

## 2020-07-27 MED FILL — Fluoxetine HCl Cap 20 MG: ORAL | 90 days supply | Qty: 270 | Fill #0 | Status: AC

## 2020-07-28 ENCOUNTER — Other Ambulatory Visit (HOSPITAL_COMMUNITY): Payer: Self-pay

## 2020-08-17 MED FILL — Propranolol HCl Tab 40 MG: ORAL | 90 days supply | Qty: 180 | Fill #0 | Status: AC

## 2020-08-18 ENCOUNTER — Other Ambulatory Visit (HOSPITAL_COMMUNITY): Payer: Self-pay

## 2020-09-14 MED FILL — Norethindrone Tab 0.35 MG: ORAL | 84 days supply | Qty: 84 | Fill #1 | Status: AC

## 2020-09-14 MED FILL — Zonisamide Cap 50 MG: ORAL | 90 days supply | Qty: 90 | Fill #0 | Status: AC

## 2020-09-15 ENCOUNTER — Other Ambulatory Visit (HOSPITAL_COMMUNITY): Payer: Self-pay

## 2020-09-17 ENCOUNTER — Other Ambulatory Visit (HOSPITAL_COMMUNITY): Payer: Self-pay

## 2020-09-18 ENCOUNTER — Other Ambulatory Visit (HOSPITAL_COMMUNITY): Payer: Self-pay

## 2020-09-22 ENCOUNTER — Other Ambulatory Visit (HOSPITAL_COMMUNITY): Payer: Self-pay

## 2020-09-23 ENCOUNTER — Other Ambulatory Visit (HOSPITAL_COMMUNITY): Payer: Self-pay

## 2020-09-26 ENCOUNTER — Other Ambulatory Visit (HOSPITAL_COMMUNITY): Payer: Self-pay

## 2020-10-01 ENCOUNTER — Other Ambulatory Visit (HOSPITAL_COMMUNITY): Payer: Self-pay

## 2020-10-02 ENCOUNTER — Other Ambulatory Visit (HOSPITAL_COMMUNITY): Payer: Self-pay

## 2020-10-04 ENCOUNTER — Other Ambulatory Visit (HOSPITAL_COMMUNITY): Payer: Self-pay

## 2020-10-06 ENCOUNTER — Other Ambulatory Visit (HOSPITAL_COMMUNITY): Payer: Self-pay

## 2020-10-07 ENCOUNTER — Other Ambulatory Visit (HOSPITAL_COMMUNITY): Payer: Self-pay

## 2020-10-07 MED ORDER — QUETIAPINE FUMARATE 100 MG PO TABS
ORAL_TABLET | Freq: Two times a day (BID) | ORAL | 1 refills | Status: DC
  Filled 2020-10-07 (×2): qty 180, 90d supply, fill #0
  Filled 2021-01-11: qty 180, 90d supply, fill #1

## 2020-10-08 ENCOUNTER — Other Ambulatory Visit (HOSPITAL_COMMUNITY): Payer: Self-pay

## 2020-10-30 MED FILL — Fluoxetine HCl Cap 20 MG: ORAL | 90 days supply | Qty: 270 | Fill #1 | Status: AC

## 2020-10-31 ENCOUNTER — Other Ambulatory Visit (HOSPITAL_COMMUNITY): Payer: Self-pay

## 2020-11-08 MED FILL — Propranolol HCl Tab 40 MG: ORAL | 90 days supply | Qty: 180 | Fill #1 | Status: AC

## 2020-11-10 ENCOUNTER — Other Ambulatory Visit (HOSPITAL_COMMUNITY): Payer: Self-pay

## 2020-12-17 ENCOUNTER — Other Ambulatory Visit: Payer: Self-pay | Admitting: Obstetrics and Gynecology

## 2020-12-17 DIAGNOSIS — G43101 Migraine with aura, not intractable, with status migrainosus: Secondary | ICD-10-CM

## 2020-12-17 DIAGNOSIS — N946 Dysmenorrhea, unspecified: Secondary | ICD-10-CM

## 2020-12-17 MED FILL — Zonisamide Cap 50 MG: ORAL | 90 days supply | Qty: 90 | Fill #1 | Status: AC

## 2020-12-18 ENCOUNTER — Other Ambulatory Visit (HOSPITAL_COMMUNITY): Payer: Self-pay

## 2020-12-18 MED ORDER — NORETHINDRONE 0.35 MG PO TABS
1.0000 | ORAL_TABLET | Freq: Every day | ORAL | 0 refills | Status: DC
  Filled 2020-12-18: qty 84, 84d supply, fill #0

## 2020-12-18 NOTE — Telephone Encounter (Signed)
Annual exam on 12/29/20

## 2020-12-19 ENCOUNTER — Other Ambulatory Visit (HOSPITAL_COMMUNITY): Payer: Self-pay

## 2020-12-22 ENCOUNTER — Other Ambulatory Visit (HOSPITAL_COMMUNITY): Payer: Self-pay

## 2020-12-23 ENCOUNTER — Other Ambulatory Visit (HOSPITAL_COMMUNITY): Payer: Self-pay

## 2020-12-29 ENCOUNTER — Ambulatory Visit: Payer: No Typology Code available for payment source | Admitting: Obstetrics and Gynecology

## 2020-12-30 ENCOUNTER — Other Ambulatory Visit (HOSPITAL_COMMUNITY): Payer: Self-pay

## 2021-01-12 ENCOUNTER — Other Ambulatory Visit (HOSPITAL_COMMUNITY): Payer: Self-pay

## 2021-01-25 ENCOUNTER — Other Ambulatory Visit (HOSPITAL_COMMUNITY): Payer: Self-pay

## 2021-01-26 ENCOUNTER — Other Ambulatory Visit (HOSPITAL_COMMUNITY): Payer: Self-pay

## 2021-01-27 ENCOUNTER — Other Ambulatory Visit (HOSPITAL_COMMUNITY): Payer: Self-pay

## 2021-01-27 MED ORDER — FLUOXETINE HCL 20 MG PO CAPS
ORAL_CAPSULE | ORAL | 2 refills | Status: AC
  Filled 2021-01-27: qty 270, 90d supply, fill #0
  Filled 2021-04-12: qty 270, 90d supply, fill #1

## 2021-01-27 MED ORDER — PROPRANOLOL HCL 40 MG PO TABS
ORAL_TABLET | ORAL | 2 refills | Status: AC
  Filled 2021-01-27: qty 180, 90d supply, fill #0
  Filled 2021-05-17: qty 180, 90d supply, fill #1

## 2021-02-26 ENCOUNTER — Other Ambulatory Visit (HOSPITAL_COMMUNITY): Payer: Self-pay

## 2021-02-26 MED ORDER — PAXLOVID (300/100) 20 X 150 MG & 10 X 100MG PO TBPK
ORAL_TABLET | ORAL | 0 refills | Status: DC
  Filled 2021-02-26: qty 30, 5d supply, fill #0

## 2021-02-26 MED ORDER — PROMETHAZINE HCL 6.25 MG/5ML PO SYRP
ORAL_SOLUTION | ORAL | 0 refills | Status: DC
  Filled 2021-02-26: qty 200, 6d supply, fill #0

## 2021-03-11 ENCOUNTER — Other Ambulatory Visit (HOSPITAL_COMMUNITY): Payer: Self-pay

## 2021-03-11 ENCOUNTER — Other Ambulatory Visit: Payer: Self-pay | Admitting: Obstetrics and Gynecology

## 2021-03-11 DIAGNOSIS — N946 Dysmenorrhea, unspecified: Secondary | ICD-10-CM

## 2021-03-11 DIAGNOSIS — G43101 Migraine with aura, not intractable, with status migrainosus: Secondary | ICD-10-CM

## 2021-03-11 MED ORDER — NORETHINDRONE 0.35 MG PO TABS
1.0000 | ORAL_TABLET | Freq: Every day | ORAL | 0 refills | Status: DC
  Filled 2021-03-11: qty 84, 84d supply, fill #0

## 2021-03-11 NOTE — Telephone Encounter (Signed)
Last AEX 12/25/19. Scheduled 04/15/2021.

## 2021-03-12 ENCOUNTER — Other Ambulatory Visit (HOSPITAL_COMMUNITY): Payer: Self-pay

## 2021-03-22 ENCOUNTER — Other Ambulatory Visit (HOSPITAL_COMMUNITY): Payer: Self-pay

## 2021-03-22 MED FILL — Zonisamide Cap 50 MG: ORAL | 90 days supply | Qty: 90 | Fill #2 | Status: AC

## 2021-03-23 ENCOUNTER — Other Ambulatory Visit (HOSPITAL_COMMUNITY): Payer: Self-pay

## 2021-03-24 ENCOUNTER — Other Ambulatory Visit (HOSPITAL_COMMUNITY): Payer: Self-pay

## 2021-03-25 ENCOUNTER — Other Ambulatory Visit (HOSPITAL_COMMUNITY): Payer: Self-pay

## 2021-03-25 MED ORDER — PREGABALIN 100 MG PO CAPS
100.0000 mg | ORAL_CAPSULE | Freq: Three times a day (TID) | ORAL | 1 refills | Status: AC
  Filled 2021-03-25: qty 270, 90d supply, fill #0
  Filled 2021-03-29 – 2021-06-21 (×2): qty 270, 90d supply, fill #1

## 2021-03-26 ENCOUNTER — Other Ambulatory Visit (HOSPITAL_COMMUNITY): Payer: Self-pay

## 2021-03-30 ENCOUNTER — Other Ambulatory Visit (HOSPITAL_COMMUNITY): Payer: Self-pay

## 2021-04-01 ENCOUNTER — Ambulatory Visit: Payer: No Typology Code available for payment source | Admitting: Nurse Practitioner

## 2021-04-12 ENCOUNTER — Other Ambulatory Visit (HOSPITAL_COMMUNITY): Payer: Self-pay

## 2021-04-13 ENCOUNTER — Other Ambulatory Visit (HOSPITAL_COMMUNITY): Payer: Self-pay

## 2021-04-15 ENCOUNTER — Encounter: Payer: Self-pay | Admitting: Obstetrics and Gynecology

## 2021-04-15 ENCOUNTER — Other Ambulatory Visit (HOSPITAL_COMMUNITY)
Admission: RE | Admit: 2021-04-15 | Discharge: 2021-04-15 | Disposition: A | Discharge status: Home / Self Care | Payer: No Typology Code available for payment source | Source: Ambulatory Visit | Attending: Obstetrics and Gynecology | Admitting: Obstetrics and Gynecology

## 2021-04-15 ENCOUNTER — Other Ambulatory Visit: Payer: Self-pay

## 2021-04-15 ENCOUNTER — Other Ambulatory Visit (HOSPITAL_COMMUNITY): Payer: Self-pay

## 2021-04-15 ENCOUNTER — Ambulatory Visit (INDEPENDENT_AMBULATORY_CARE_PROVIDER_SITE_OTHER): Payer: No Typology Code available for payment source | Admitting: Obstetrics and Gynecology

## 2021-04-15 VITALS — BP 134/68 | HR 84 | Ht 65.0 in | Wt 235.0 lb

## 2021-04-15 DIAGNOSIS — Z124 Encounter for screening for malignant neoplasm of cervix: Secondary | ICD-10-CM | POA: Diagnosis present

## 2021-04-15 DIAGNOSIS — G43101 Migraine with aura, not intractable, with status migrainosus: Secondary | ICD-10-CM

## 2021-04-15 DIAGNOSIS — Z01419 Encounter for gynecological examination (general) (routine) without abnormal findings: Secondary | ICD-10-CM

## 2021-04-15 DIAGNOSIS — Z23 Encounter for immunization: Secondary | ICD-10-CM | POA: Diagnosis not present

## 2021-04-15 DIAGNOSIS — N946 Dysmenorrhea, unspecified: Secondary | ICD-10-CM | POA: Diagnosis not present

## 2021-04-15 MED ORDER — NORETHINDRONE 0.35 MG PO TABS
1.0000 | ORAL_TABLET | Freq: Every day | ORAL | 3 refills | Status: DC
  Filled 2021-04-15 – 2021-06-09 (×2): qty 84, 84d supply, fill #0

## 2021-04-15 MED ORDER — QUETIAPINE FUMARATE 100 MG PO TABS
100.0000 mg | ORAL_TABLET | Freq: Two times a day (BID) | ORAL | 2 refills | Status: AC
  Filled 2021-04-15: qty 180, 90d supply, fill #0
  Filled 2021-07-09: qty 180, 90d supply, fill #1

## 2021-04-15 NOTE — Patient Instructions (Signed)

## 2021-04-15 NOTE — Progress Notes (Signed)
31 y.o. G0P0000 Single Caucasian female here for annual exam.   Mother is present for the entire visit today.   Using progesterone only birth control. Menses are lighter.  No less cramping.   May still get a headache around her period time, but much less often than in the past.   Writing a romance novel.   PCP:  Allyn Kenner, MD   Patient's last menstrual period was 04/01/2021 (approximate).     Period Cycle (Days): 30 Period Duration (Days): 3-4 days Period Pattern: Regular Menstrual Flow: Light Menstrual Control: Maxi pad Dysmenorrhea: (!) Moderate Dysmenorrhea Symptoms: Cramping, Headache     Sexually active: No.  Abstinence.  The current method of family planning is oral progesterone-only contraceptive.    Exercising: No.  The patient does not participate in regular exercise at present. Smoker:  no  Health Maintenance: Pap:  09-30-17 Neg, 09-20-14 Neg History of abnormal Pap:  no MMG:   09-23-14 Lt.Br.US/Neg/BiRads1 Colonoscopy:  n/a BMD:   n/a  Result  n/a TDaP:  09-28-16 Gardasil:   03-15-09 -- unsure if completed HIV:no Hep C:no Screening Labs:PCP Flu vaccine:  completed.   reports that she has never smoked. She has never used smokeless tobacco. She reports that she does not drink alcohol and does not use drugs.  Past Medical History:  Diagnosis Date   Asthma    Common migraine with intractable migraine 06/22/2018   Disturbance of skin sensation 12/04/2013   Environmental allergies    seasonal   Fibromyalgia    Memory loss    Migraines    with aura   Occasional tremors    Psychosis ([place])    Schizophrenia ([place])    Secondary parkinsonism ([place]) 09/23/2014    History reviewed. No pertinent surgical history.  Current Outpatient Medications  Medication Sig Dispense Refill   cetirizine (ZYRTEC) 10 MG tablet Take 10 mg by mouth daily.     Deutetrabenazine (AUSTEDO) 12 MG TABS Take by mouth. 23m bid     FLUoxetine (PROZAC) 20 MG capsule Take 1 capsule by mouth in the  morning and then 2 capsules by mouth in the evening 270 capsule 2   MELATONIN PO Take 10 mg by mouth as needed.      methocarbamol (ROBAXIN) 750 MG tablet TAKE 1 TABLET BY MOUTH EVERY 8 HOURS AS NEEDED FOR HEADACHE 30 tablet 3   Multiple Vitamin (MULTIVITAMIN PO) Take by mouth.     pregabalin (LYRICA) 100 MG capsule Take 1 capsule by mouth 3 times daily. 270 capsule 1   PROAIR HFA 108 (90 BASE) MCG/ACT inhaler Inhale 1 puff into the lungs every 6 (six) hours as needed for wheezing or shortness of breath.      propranolol (INDERAL) 40 MG tablet Take 1 tablet by mouth 2 times a day 180 tablet 2   QUEtiapine (SEROQUEL) 100 MG tablet TAKE 1 TABLET BY MOUTH TWICE DAILY 180 tablet 1   QUEtiapine (SEROQUEL) 100 MG tablet Take 1 tablet (100 mg total) by mouth 2 (two) times daily. 180 tablet 2   SUMAtriptan (IMITREX) 50 MG tablet TAKE 1 TABLET BY MOUTH AT ONSET OF MIGRAINE. MAY REPEAT IN 2 HRS, IF NEEDED. MAX DOSE: 2 TABS/DAY. THIS IS A 30 DAY PRESCRIPTION. 10 tablet 5   zonisamide (ZONEGRAN) 50 MG capsule TAKE 1 CAPSULE BY MOUTH AT BEDTIME. 90 capsule 3   albuterol (VENTOLIN HFA) 108 (90 Base) MCG/ACT inhaler INHALE 2 PUFFS EVERY 6 HOURS AS NEEDED FOR SHORTNESS OF BREATH OR WHEEZING 18  g 2   Amantadine HCl 100 MG tablet TAKE 1 TABLET BY MOUTH TWICE DAILY 360 tablet 2   norethindrone (MICRONOR) 0.35 MG tablet TAKE 1 TABLET BY MOUTH ONCE DAILY 84 tablet 3   No current facility-administered medications for this visit.    Family History  Problem Relation Age of Onset   Breast cancer Mother        BRAC negative  age 76   Diabetes Father    Hypertension Father    Pulmonary embolism Father    Thyroid disease Maternal Aunt    Breast cancer Maternal Aunt 45       BRAC negative   Breast cancer Maternal Aunt 30   Thyroid disease Maternal Grandmother    Alzheimer's disease Maternal Grandfather    Lung cancer Paternal Grandmother    Depression Paternal Grandmother    Heart attack Paternal Grandfather     Depression Paternal Grandfather     Review of Systems  All other systems reviewed and are negative.  Exam:   BP 134/68    Pulse 84    Ht _0  (1.651 m)    Wt 235 lb (106.6 kg)    LMP 04/01/2021 (Approximate)    SpO2 97%    BMI 39.11 kg/m     General appearance: alert, cooperative and appears stated age Head: normocephalic, without obvious abnormality, atraumatic Neck: no adenopathy, supple, symmetrical, trachea midline and thyroid normal to inspection and palpation Lungs: clear to auscultation bilaterally Breasts: normal appearance, no masses or tenderness, No nipple retraction or dimpling, No nipple discharge or bleeding, No axillary adenopathy Heart: regular rate and rhythm Abdomen: soft, non-tender; no masses, no organomegaly Extremities: extremities normal, atraumatic, no cyanosis or edema Skin: skin color, texture, turgor normal. No rashes or lesions Lymph nodes: cervical, supraclavicular, and axillary nodes normal. Neurologic: grossly normal  Pelvic: External genitalia:  no lesions              No abnormal inguinal nodes palpated.              Urethra:  normal appearing urethra with no masses, tenderness or lesions              Bartholins and Skenes: normal                 Vagina: normal appearing vagina with normal color and discharge, no lesions              Cervix: no lesions              Pap taken: yes Bimanual Exam:  Uterus:  normal size, contour, position, consistency, mobility, non-tender              Adnexa: no mass, fullness, tenderness              Bimanual exam limited by patient contracting abdominal wall with exam.  Chaperone was present for exam:  Estill Bamberg, CMA  Assessment:   Well woman visit with gynecologic exam. Migraine with aura.  FH of breast cancer.  Mother negative for BRCA. On Micronor.   Schizophrenia with secondary parkinson's.  Plan: Mammogram screening age 67 yo.  Self breast awareness reviewed. Pap and reflex HR HPV Guidelines for Calcium,  Vitamin D, regular exercise program including cardiovascular and weight bearing exercise. Start HPV vaccine series.  Refill of Micronor 90 day Rx with 3 refills.  Follow up annually and prn.  After visit summary provided.

## 2021-04-17 LAB — CYTOLOGY - PAP: Diagnosis: NEGATIVE

## 2021-05-11 ENCOUNTER — Other Ambulatory Visit (HOSPITAL_COMMUNITY): Payer: Self-pay

## 2021-05-11 MED FILL — Methocarbamol Tab 750 MG: ORAL | 10 days supply | Qty: 30 | Fill #0 | Status: AC

## 2021-05-17 ENCOUNTER — Other Ambulatory Visit (HOSPITAL_COMMUNITY): Payer: Self-pay

## 2021-05-18 ENCOUNTER — Other Ambulatory Visit (HOSPITAL_COMMUNITY): Payer: Self-pay

## 2021-06-09 ENCOUNTER — Other Ambulatory Visit (HOSPITAL_COMMUNITY): Payer: Self-pay

## 2021-06-15 ENCOUNTER — Ambulatory Visit: Payer: No Typology Code available for payment source | Admitting: Neurology

## 2021-06-16 ENCOUNTER — Ambulatory Visit (INDEPENDENT_AMBULATORY_CARE_PROVIDER_SITE_OTHER): Payer: No Typology Code available for payment source

## 2021-06-16 DIAGNOSIS — Z23 Encounter for immunization: Secondary | ICD-10-CM | POA: Diagnosis not present

## 2021-06-21 ENCOUNTER — Other Ambulatory Visit: Payer: Self-pay | Admitting: Neurology

## 2021-06-22 ENCOUNTER — Other Ambulatory Visit (HOSPITAL_COMMUNITY): Payer: Self-pay

## 2021-06-22 MED ORDER — ZONISAMIDE 50 MG PO CAPS
ORAL_CAPSULE | Freq: Every day | ORAL | 0 refills | Status: DC
  Filled 2021-06-22: qty 90, 90d supply, fill #0

## 2021-06-22 NOTE — Telephone Encounter (Signed)
Rx refilled.

## 2021-06-23 ENCOUNTER — Other Ambulatory Visit (HOSPITAL_COMMUNITY): Payer: Self-pay

## 2021-07-09 ENCOUNTER — Other Ambulatory Visit (HOSPITAL_COMMUNITY): Payer: Self-pay

## 2021-07-29 ENCOUNTER — Ambulatory Visit: Payer: No Typology Code available for payment source | Admitting: Neurology

## 2021-08-03 ENCOUNTER — Other Ambulatory Visit (HOSPITAL_COMMUNITY): Payer: Self-pay

## 2021-09-09 ENCOUNTER — Other Ambulatory Visit (HOSPITAL_COMMUNITY): Payer: Self-pay

## 2021-09-10 ENCOUNTER — Other Ambulatory Visit (HOSPITAL_COMMUNITY): Payer: Self-pay

## 2021-09-14 ENCOUNTER — Other Ambulatory Visit: Payer: Self-pay

## 2021-09-14 MED ORDER — ZONISAMIDE 50 MG PO CAPS: ORAL_CAPSULE | Freq: Every day | ORAL | 0 refills | Status: DC

## 2021-09-14 NOTE — Progress Notes (Signed)
Rx refilled. Pt must keep appointment on 12/16/2021 for further refills.

## 2021-10-16 ENCOUNTER — Ambulatory Visit: Payer: No Typology Code available for payment source

## 2021-10-19 NOTE — Progress Notes (Unsigned)
Patient in today for 3 Gardasil injection.   Contraception: pop Last AEX: 04/15/21 with bs  Injection given in left deltoid. Patient tolerated shot well.   Patient informed she is finished with the series.  Advised patient, if not on birth control, to return for next injection with cycle.   Routed to provider for final review.  Encounter closed.

## 2021-10-20 ENCOUNTER — Ambulatory Visit (INDEPENDENT_AMBULATORY_CARE_PROVIDER_SITE_OTHER): Payer: BLUE CROSS/BLUE SHIELD

## 2021-10-20 DIAGNOSIS — Z23 Encounter for immunization: Secondary | ICD-10-CM

## 2021-10-25 ENCOUNTER — Other Ambulatory Visit: Payer: Self-pay | Admitting: Neurology

## 2021-12-02 NOTE — Progress Notes (Unsigned)
PATIENT: Jennifer [name] DOB: 06/14/1990  REASON FOR VISIT: follow up HISTORY FROM: patient, mother PRIMARY NEUROLOGIST: Willis/Chima   HISTORY OF PRESENT ILLNESS: Today 12/03/21 Jennifer [name] is here today for follow-up. Remains on Zonegran 50 mg at bedtime for headache prevention. Not taking Imitrex 50 mg, didn't feel like it was working. Takes 750 mg Robaxin for acute headache. Last week had 3 migraines when on her menstrual cycle. On average 5-6 migraines a month total. Has menstrual cycle of 3 days, usually has migraine those dose. Still seeing psych NP, doing well, tremor is better in hands. She is drinking more water. Has lost about 13 lbs. Migraines were under good control, but increased recently.  With psychiatry remains on Austedo, Lyrica, propanolol, Seroquel. Tarae looks good today.  Update 06/12/20 SS: Jennifer [name] is a 31 year old female with history of schizophrenia, migraine headaches, and tremor.  Headaches are much improved with Zonegran, on average 1-2 migraines a month, has excellent benefit with Imitrex, Robaxin, and ibuprofen.  Tremor is overall stable, has adjusted propanolol to taking twice daily, second dose is in the evening.  Notes tremor with eating, mostly affects the arms.  On exam, seem to be more action, she claims a resting.  At times may have some speech stuttering.  She follows with psychiatry, has been switched from amantadine to [place], unfortunately has resulted in weight gain (33 lbs last 6 months).  Is not currently working or in school.  Overall, is doing well, pleased with her current status.  Here today for evaluation accompanied by her mother.  HISTORY 11/27/2019 Dr. Jannifer Franklin: Jennifer [name] is a 31 year old right-handed white female with a history of schizophrenia.  The patient has a history of migraine headaches and tremors that have been a chronic issue for her.  When last seen, she was placed on Zonegran in low-dose, she takes 50 mg at night.  The patient has  done quite well with the Zonegran, she is only having about 1 headache a week and she tolerates the medication well.  She did not tolerate Topamax previously.  She denies any nausea and vomiting with the headache, she claims that she can function through the headache.  At times, while having a migraine she may have some stuttering speech problems.  This does not translate into other timeframes.  The patient reports that she does have tremors that affect the arms and occasionally in the legs.  She reports that the tremors are mainly a resting type tremor and they keep her awake at night.  The patient take propranolol 40 mg twice a day, but she takes the medication in the morning and then early afternoon and not in the evening.  The patient denies taking any caffeinated products.  She comes to this office for further evaluation.  The tremor does affect the handwriting.  She denies any prior use of lithium.   REVIEW OF SYSTEMS: Out of a complete 14 system review of symptoms, the patient complains only of the following symptoms, and all other reviewed systems are negative.  See HPI  ALLERGIES: Allergies  Allergen Reactions   Ambien [Zolpidem Tartrate]     hallucinations   Cogentin [Benztropine]     Rash, itching, dry mouth   Topamax [Topiramate]     Cognitive side effects    HOME MEDICATIONS: Outpatient Medications Prior to Visit  Medication Sig Dispense Refill   atorvastatin (LIPITOR) 10 MG tablet Take 10 mg by mouth at bedtime.     cetirizine (ZYRTEC) 10  MG tablet Take 10 mg by mouth daily.     Deutetrabenazine (AUSTEDO) 12 MG TABS Take by mouth. 6mg  bid     FLUoxetine (PROZAC) 20 MG capsule Take 1 capsule by mouth in the morning and then 2 capsules by mouth in the evening 270 capsule 2   MELATONIN PO Take 10 mg by mouth as needed.      Multiple Vitamin (MULTIVITAMIN PO) Take by mouth.     norethindrone (MICRONOR) 0.35 MG tablet TAKE 1 TABLET BY MOUTH ONCE DAILY 84 tablet 3   pregabalin  (LYRICA) 100 MG capsule Take 1 capsule by mouth 3 times daily. 270 capsule 1   PROAIR HFA 108 (90 BASE) MCG/ACT inhaler Inhale 1 puff into the lungs every 6 (six) hours as needed for wheezing or shortness of breath.      propranolol (INDERAL) 40 MG tablet Take 1 tablet by mouth 2 times a day 180 tablet 2   QUEtiapine (SEROQUEL) 100 MG tablet Take 1 tablet (100 mg total) by mouth 2 (two) times daily. 180 tablet 2   zonisamide (ZONEGRAN) 50 MG capsule TAKE 1 CAPSULE BY MOUTH EVERYDAY AT BEDTIME 90 capsule 2   albuterol (VENTOLIN HFA) 108 (90 Base) MCG/ACT inhaler INHALE 2 PUFFS EVERY 6 HOURS AS NEEDED FOR SHORTNESS OF BREATH OR WHEEZING 18 g 2   Amantadine HCl 100 MG tablet TAKE 1 TABLET BY MOUTH TWICE DAILY 360 tablet 2   QUEtiapine (SEROQUEL) 100 MG tablet TAKE 1 TABLET BY MOUTH TWICE DAILY 180 tablet 1   SUMAtriptan (IMITREX) 50 MG tablet TAKE 1 TABLET BY MOUTH AT ONSET OF MIGRAINE. MAY REPEAT IN 2 HRS, IF NEEDED. MAX DOSE: 2 TABS/DAY. THIS IS A 30 DAY PRESCRIPTION. 10 tablet 5   No facility-administered medications prior to visit.    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 No pertinent surgical history.  FAMILY HISTORY: Family History  Problem Relation Age of Onset   Breast cancer Mother        BRAC negative  age 82   Diabetes Father    Hypertension Father    Pulmonary embolism Father    Thyroid disease Maternal Aunt    Breast cancer Maternal Aunt 45       BRAC negative   Breast cancer Maternal Aunt 22   Migraines Maternal Aunt    Thyroid disease Maternal Grandmother    Alzheimer's disease Maternal  Grandfather    Lung cancer Paternal Grandmother    Depression Paternal Grandmother    Heart attack Paternal Grandfather    Depression Paternal Grandfather     SOCIAL HISTORY: Social History   Socioeconomic History   Marital status: Single    Spouse name: Not on file   Number of children: 0   Years of education: college/st   Highest education level: Not on file  Occupational History   Occupation: STUDENT    Employer: UNEMPLOYED  Tobacco Use   Smoking status: Never   Smokeless tobacco: Never  Vaping Use   Vaping Use: Never used  Substance and Sexual Activity  Alcohol use: No   Drug use: No   Sexual activity: Never    Partners: Male    Birth control/protection: Pill    Comment: never sexually active  Other Topics Concern   Not on file  Social History Narrative   Patient is right handed.   Patient does not drink caffeine.   Patient lives at home with family.   Social Determinants of Health   Financial Resource Strain: Not on file  Food Insecurity: Not on file  Transportation Needs: Not on file  Physical Activity: Not on file  Stress: Not on file  Social Connections: Not on file  Intimate Partner Violence: Not on file   PHYSICAL EXAM  Vitals:   12/03/21 0730  BP: 93/63  Pulse: 78  Weight: 232 lb (105.2 kg)  Height: 5\' 6"  (1.676 m)    Body mass index is 37.45 kg/m.  Generalized: Well developed, in no acute distress  Neurological examination  Mentation: Alert oriented to time, place, most history is provided by her mother, Jennifer [name] is more talkative today. Follows all commands speech and language fluent, Flat affect Cranial nerve II-XII: Pupils were equal round reactive to light. Extraocular movements were full, visual field were full on confrontational test. Facial sensation and strength were normal. Head turning and shoulder shrug  were normal and symmetric. Motor: The motor testing reveals 5 over 5 strength of all 4 extremities. Good symmetric motor tone is  noted throughout.  Sensory: Sensory testing is intact to soft touch on all 4 extremities. No evidence of extinction is noted.  Coordination: Very Mild tremor with finger-nose-finger bilaterally, heel-to-shin is normal Gait and station: Gait is normal.  Tandem gait is slightly unsteady. Reflexes: Deep tendon reflexes are symmetric but brisk in the lower extremities  DIAGNOSTIC DATA (LABS, IMAGING, TESTING) - I reviewed patient records, labs, notes, testing and imaging myself where available.  Lab Results  Component Value Date   WBC 10.4 02/22/2016   HGB 14.1 02/22/2016   HCT 42.5 02/22/2016   MCV 89.7 02/22/2016   PLT 283 02/22/2016      Component Value Date/Time   NA 135 02/22/2016 2332   K 3.9 02/22/2016 2332   CL 105 02/22/2016 2332   CO2 23 02/22/2016 2332   GLUCOSE 116 (H) 02/22/2016 2332   BUN 17 02/22/2016 2332   CREATININE 0.61 02/22/2016 2332   CALCIUM 9.2 02/22/2016 2332   PROT 7.7 02/22/2016 2332   ALBUMIN 3.6 02/22/2016 2332   AST 17 02/22/2016 2332   ALT 20 02/22/2016 2332   ALKPHOS 75 02/22/2016 2332   BILITOT 0.3 02/22/2016 2332   GFRNONAA >60 02/22/2016 2332   GFRAA >60 02/22/2016 2332   Lab Results  Component Value Date   CHOL 229 (H) 07/12/2015   HDL 52 07/12/2015   LDLCALC 155 (H) 07/12/2015   TRIG 111 07/12/2015   CHOLHDL 4.4 07/12/2015   Lab Results  Component Value Date   HGBA1C 5.4 07/12/2015   Lab Results  Component Value Date   VITAMINB12 719 01/16/2013   Lab Results  Component Value Date   TSH 1.670 07/12/2015   ASSESSMENT AND PLAN 31 y.o. 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 here with:  1.  History of schizophrenia 2.  Migraine headache 3.  Tremor,  secondary parkinsonism  -Increase in  headaches -Increase Zonegran 75 mg at bedtime for migraine preventative -Try Nurtec 75 mg for acute headache treatment, can combine with Robaxin for prolonged headache -Try naproxen 500 mg twice a day 1 to 2 days before menstrual cycle, can continue for no more than 5 days -Also on Austedo, Lyrica, propanolol, Seroquel, from other providers -Previously tried and failed: Imitrex, Maxalt, Aimovig, Topamax -She will call for dose adjustment, follow-up in 6 months, she would like to meet Dr. Iran Ouch, AGNP-C, DNP 12/03/2021, 8:13 AM [facility] [address], [place], [place] [address] [phone]

## 2021-12-03 ENCOUNTER — Ambulatory Visit (INDEPENDENT_AMBULATORY_CARE_PROVIDER_SITE_OTHER): Payer: BLUE CROSS/BLUE SHIELD | Admitting: Neurology

## 2021-12-03 ENCOUNTER — Encounter: Payer: Self-pay | Admitting: Neurology

## 2021-12-03 VITALS — BP 93/63 | HR 78 | Ht 66.0 in | Wt 232.0 lb

## 2021-12-03 DIAGNOSIS — G43019 Migraine without aura, intractable, without status migrainosus: Secondary | ICD-10-CM

## 2021-12-03 DIAGNOSIS — G250 Essential tremor: Secondary | ICD-10-CM

## 2021-12-03 MED ORDER — ZONISAMIDE 25 MG PO CAPS
75.0000 mg | ORAL_CAPSULE | Freq: Every day | ORAL | 1 refills | Status: DC
Start: 2021-12-03 — End: 2022-06-22

## 2021-12-03 MED ORDER — METHOCARBAMOL 750 MG PO TABS: 750.0000 mg | ORAL_TABLET | ORAL | 3 refills | Status: DC | PRN

## 2021-12-03 MED ORDER — NURTEC 75 MG PO TBDP
75.0000 mg | ORAL_TABLET | ORAL | 11 refills | Status: DC | PRN
Start: 2021-12-03 — End: 2022-06-29

## 2021-12-03 NOTE — Patient Instructions (Addendum)
Increase the Zonegran to 75 mg at bedtime to help with migraine prevention  Instead of Imitrex will try Nurtec for acute headache, can combine with Robaxin if needed For menstrual migraines, try naproxen 500 mg twice daily 1-2 days before cycle, can take for up to 5 days See you back in 6 months, can do virtually if wanted, will see Dr. Billey Gosling in 6 months  Meds ordered this encounter  Medications   methocarbamol (ROBAXIN) 750 MG tablet    Sig: Take 1 tablet (750 mg total) by mouth as needed for muscle spasms (take 1 at onset of headache).    Dispense:  30 tablet    Refill:  3   zonisamide (ZONEGRAN) 25 MG capsule    Sig: Take 3 capsules (75 mg total) by mouth at bedtime.    Dispense:  270 capsule    Refill:  1   Rimegepant Sulfate (NURTEC) 75 MG TBDP    Sig: Take 75 mg by mouth as needed (take 1 tablet at onset of headache, max is 1 tablet in 24 hours).    Dispense:  8 tablet    Refill:  11

## 2021-12-07 ENCOUNTER — Telehealth: Payer: Self-pay

## 2021-12-07 NOTE — Telephone Encounter (Signed)
Key: [id number] - PA Case ID: [id number] - Rx #: [id number] Approved today Your PA request has been approved. Additional information will be provided in the approval communication.  Nurtec 75MG  dispersible tablets

## 2021-12-16 ENCOUNTER — Ambulatory Visit: Payer: No Typology Code available for payment source | Admitting: Neurology

## 2022-02-03 ENCOUNTER — Ambulatory Visit: Payer: No Typology Code available for payment source | Admitting: Neurology

## 2022-04-12 NOTE — Progress Notes (Signed)
32 y.o. G0P0000 Single Caucasian female here for annual exam.    Patient's mother is present for the visit today and was excused briefly for discussion with the patient.   Patient's birth control reduces her bleeding and cramping.  Patient notes some skin redness under her breasts.  Having increased HAs around her menstrual cycles.  She sees [facility] and takes Nurtec.   Not SA in the past.  Not planning for this in the near future.    PCP:   Allyn Kenner, MD  Patient's last menstrual period was 04/19/2022.     Period Cycle (Days): 28 Period Duration (Days): 4-5 Period Pattern: Regular Menstrual Flow: Moderate     Sexually active: No.  The current method of family planning is oral progesterone-only contraceptive.    Exercising: No.     Smoker:  no  Health Maintenance: Pap:  04/15/21 neg, 09/30/17 neg History of abnormal Pap:  no MMG:  09-23-14 Lt.Br.US/Neg/BiRads1  Colonoscopy:  n/a BMD:   n/a  Result  n/a TDaP:  09/28/16 Gardasil:   yes HIV: no Hep C: no Screening Labs:   PCP   reports that she has never smoked. She has never used smokeless tobacco. She reports that she does not drink alcohol and does not use drugs.  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 No pertinent surgical history.  Current Outpatient Medications  Medication Sig Dispense Refill   atorvastatin (LIPITOR) 10 MG tablet Take 10 mg by mouth at bedtime.     cetirizine (ZYRTEC) 10 MG tablet Take 10 mg by mouth daily.     Deutetrabenazine (AUSTEDO) 12 MG TABS Take by mouth. 46m bid     FLUoxetine (PROZAC) 20 MG capsule Take 1 capsule by mouth in the morning and then 2  capsules by mouth in the evening 270 capsule 2   MELATONIN PO Take 10 mg by mouth as needed.      methocarbamol (ROBAXIN) 750 MG tablet Take 1 tablet (750 mg total) by mouth as needed for muscle spasms (take 1 at onset of headache). 30 tablet 3   Multiple Vitamin (MULTIVITAMIN PO) Take by mouth.     pregabalin (LYRICA) 100 MG capsule Take 1 capsule by mouth 3 times daily. 270 capsule 1   PROAIR HFA 108 (90 BASE) MCG/ACT inhaler Inhale 1 puff into the lungs every 6 (six) hours as needed for wheezing or shortness of breath.      propranolol (INDERAL) 40 MG tablet Take 1 tablet by mouth 2 times a day 180 tablet 2   QUEtiapine (SEROQUEL) 100 MG tablet Take 1 tablet (100 mg total) by mouth 2 (two) times daily. 180 tablet 2   Rimegepant Sulfate (NURTEC) 75 MG TBDP Take 75 mg by mouth as needed (take 1 tablet at onset of headache, max is 1 tablet in 24 hours). 8 tablet 11   zonisamide (ZONEGRAN) 25 MG capsule Take 3 capsules (75 mg total) by mouth at bedtime. 270 capsule 1   albuterol (VENTOLIN HFA) 108 (90 Base) MCG/ACT inhaler INHALE 2 PUFFS EVERY 6 HOURS AS NEEDED FOR SHORTNESS OF BREATH  OR WHEEZING 18 g 2   Amantadine HCl 100 MG tablet TAKE 1 TABLET BY MOUTH TWICE DAILY 360 tablet 2   norethindrone (MICRONOR) 0.35 MG tablet TAKE 1 TABLET BY MOUTH ONCE DAILY 84 tablet 3   SUMAtriptan (IMITREX) 50 MG tablet TAKE 1 TABLET BY MOUTH AT ONSET OF MIGRAINE. MAY REPEAT IN 2 HRS, IF NEEDED. MAX DOSE: 2 TABS/DAY. THIS IS A 30 DAY PRESCRIPTION. 10 tablet 5   No current facility-administered medications for this visit.    Family History  Problem Relation Age of Onset   Breast cancer Mother        BRAC negative  age 50   Diabetes Father    Hypertension Father    Pulmonary embolism Father    Thyroid disease Maternal Aunt    Breast cancer Maternal Aunt 45       BRAC negative   Breast cancer Maternal Aunt 75   Migraines Maternal Aunt    Thyroid disease Maternal Grandmother    Alzheimer's disease Maternal  Grandfather    Lung cancer Paternal Grandmother    Depression Paternal Grandmother    Heart attack Paternal Grandfather    Depression Paternal Grandfather     Review of Systems  All other systems reviewed and are negative.   Exam:   BP 122/86 (BP Location: Right Arm, Patient Position: Sitting, Cuff Size: Large)   Pulse 91   Ht 5' 5"$  (1.651 m)   Wt 229 lb (103.9 kg)   LMP 04/19/2022   SpO2 96%   BMI 38.11 kg/m     General appearance: alert, cooperative and appears stated age Head: normocephalic, without obvious abnormality, atraumatic Neck: no adenopathy, supple, symmetrical, trachea midline and thyroid normal to inspection and palpation Lungs: clear to auscultation bilaterally Breasts: normal appearance, no masses or tenderness, No nipple retraction or dimpling, No nipple discharge or bleeding, No axillary adenopathy Heart: regular rate and rhythm Abdomen: soft, non-tender; no masses, no organomegaly Extremities: extremities normal, atraumatic, no cyanosis or edema Skin: skin color, texture, turgor normal. No rashes or lesions Lymph nodes: cervical, supraclavicular, and axillary nodes normal. Neurologic: grossly normal  Pelvic: External genitalia:  no lesions              No abnormal inguinal nodes palpated.              Urethra:  normal appearing urethra with no masses, tenderness or lesions              Bartholins and Skenes: normal                 Vagina: normal appearing vagina with normal color and discharge, no lesions              Cervix: no lesions              Pap taken: no Bimanual Exam:  Uterus:  normal size, contour, position, consistency, mobility, non-tender              Adnexa: no mass, fullness, tenderness           Chaperone was present for exam:  Raquel Sarna  Assessment:   Well woman visit with gynecologic exam. Migraine with aura.  FH of breast cancer.  Mother negative for BRCA. Dysmenorrhea. Controlled on Micronor.  Schizophrenia with secondary  parkinson's. Probably candida of flexural skin folds.   Plan: Mammogram screening age 59 yo Self breast awareness reviewed. Pap and HR HPV as above. Guidelines for Calcium, Vitamin D, regular exercise  program including cardiovascular and weight bearing exercise. Refill of Micronor for one year.  Rx for Nystatin powder.  Instructed in use.  She will follow up with her neurologist to discuss her menstrual migraines.   Follow up annually and prn.   After visit summary provided.

## 2022-04-26 ENCOUNTER — Ambulatory Visit (INDEPENDENT_AMBULATORY_CARE_PROVIDER_SITE_OTHER): Payer: BLUE CROSS/BLUE SHIELD | Admitting: Obstetrics and Gynecology

## 2022-04-26 ENCOUNTER — Encounter: Payer: Self-pay | Admitting: Obstetrics and Gynecology

## 2022-04-26 VITALS — BP 122/86 | HR 91 | Ht 65.0 in | Wt 229.0 lb

## 2022-04-26 DIAGNOSIS — Z01419 Encounter for gynecological examination (general) (routine) without abnormal findings: Secondary | ICD-10-CM | POA: Diagnosis not present

## 2022-04-26 DIAGNOSIS — N946 Dysmenorrhea, unspecified: Secondary | ICD-10-CM | POA: Diagnosis not present

## 2022-04-26 DIAGNOSIS — G43101 Migraine with aura, not intractable, with status migrainosus: Secondary | ICD-10-CM | POA: Diagnosis not present

## 2022-04-26 MED ORDER — NYSTATIN 100000 UNIT/GM EX POWD: 1.0000 | Freq: Three times a day (TID) | CUTANEOUS | 3 refills | Status: DC

## 2022-04-26 MED ORDER — NORETHINDRONE 0.35 MG PO TABS: 1.0000 | ORAL_TABLET | Freq: Every day | ORAL | 3 refills | Status: DC

## 2022-04-26 NOTE — Patient Instructions (Signed)

## 2022-06-20 ENCOUNTER — Other Ambulatory Visit: Payer: Self-pay | Admitting: Neurology

## 2022-06-28 NOTE — Progress Notes (Deleted)
   CC:  headaches  Follow-up Visit  Last visit: 12/03/21  Brief HPI: 32 year old female with a history of schizophrenia who follows in clinic for migraines.  At her last visit she was started on Nurtec for migraine rescue. Zonisamide was increased to 75 mg QHS.  Interval History: ***   Headache days per month: *** Migraine days per month*** Headache free days per month: ***  Current Headache Regimen: Preventative: *** Abortive: ***   Prior Therapies                                  Prevention: Topamax Zonisamide 75 mg QHS Propranolol Aimovig Lyrica  Rescue: Seroquel Imitrex 50 mg PRN Maxalt Nurtec 75 mg PRN Robaxin 750 mg PRN  Physical Exam:   Vital Signs: There were no vitals taken for this visit. GENERAL:  well appearing, in no acute distress, alert  SKIN:  Color, texture, turgor normal. No rashes or lesions HEAD:  Normocephalic/atraumatic. RESP: normal respiratory effort MSK:  No gross joint deformities.   NEUROLOGICAL: Mental Status: Alert, oriented to person, place and time, Follows commands, and Speech fluent and appropriate. Cranial Nerves: PERRL, face symmetric, no dysarthria, hearing grossly intact Motor: moves all extremities equally Gait: normal-based.  IMPRESSION: ***  PLAN: ***   Follow-up: ***  I spent a total of *** minutes on the date of the service. Headache education was done. Discussed lifestyle modification including increased oral hydration, decreased caffeine, exercise and stress management. Discussed treatment options including preventive and acute medications, natural supplements, and infusion therapy. Discussed medication overuse headache and to limit use of acute treatments to no more than 2 days/week or 10 days/month. Discussed medication side effects, adverse reactions and drug interactions. Written educational materials and patient instructions outlining all of the above were given.  Ocie Doyne, MD

## 2022-06-29 ENCOUNTER — Ambulatory Visit: Payer: BLUE CROSS/BLUE SHIELD | Admitting: Psychiatry

## 2022-06-29 ENCOUNTER — Ambulatory Visit (INDEPENDENT_AMBULATORY_CARE_PROVIDER_SITE_OTHER): Payer: BLUE CROSS/BLUE SHIELD | Admitting: Neurology

## 2022-06-29 ENCOUNTER — Encounter: Payer: Self-pay | Admitting: Neurology

## 2022-06-29 VITALS — BP 136/75 | HR 76 | Ht 66.0 in | Wt 233.0 lb

## 2022-06-29 DIAGNOSIS — G43019 Migraine without aura, intractable, without status migrainosus: Secondary | ICD-10-CM | POA: Diagnosis not present

## 2022-06-29 MED ORDER — METHOCARBAMOL 750 MG PO TABS: 750.0000 mg | ORAL_TABLET | ORAL | 3 refills | Status: DC | PRN

## 2022-06-29 MED ORDER — NURTEC 75 MG PO TBDP
75.0000 mg | ORAL_TABLET | ORAL | 11 refills | Status: DC | PRN
Start: 2022-06-29 — End: 2023-06-22

## 2022-06-29 MED ORDER — ZONISAMIDE 25 MG PO CAPS: 75.0000 mg | ORAL_CAPSULE | Freq: Every day | ORAL | 3 refills | Status: DC

## 2022-06-29 NOTE — Patient Instructions (Signed)
Continue current medications Try naproxen 500 mg twice daily 1-2 days before period, no more than 5 days to help with menstrual migraines See you back in 1 year with Dr. Delena Bali

## 2022-06-29 NOTE — Progress Notes (Signed)
PATIENT: Jennifer [name] DOB: 1990-07-01  REASON FOR VISIT: follow up HISTORY FROM: patient, father  PRIMARY NEUROLOGIST: Willis/Chima   HISTORY OF PRESENT ILLNESS: Today 06/29/22 At last visit, we increased the Zonegran from 50 to 75 mg. Has done well, down to 3 migraines a month around her period. For acute headache will take Nurtec and Robaxin. Headaches are not incapacitating. Will take a nap, it resolves within 4 hours. Tremor is better with Austedo. Still seeing psych NP.   12/03/2021 SS: Jennifer [name] is here today for follow-up. Remains on Zonegran 50 mg at bedtime for headache prevention. Not taking Imitrex 50 mg, didn't feel like it was working. Takes 750 mg Robaxin for acute headache. Last week had 3 migraines when on her menstrual cycle. On average 5-6 migraines a month total. Has menstrual cycle of 3 days, usually has migraine those dose. Still seeing psych NP, doing well, tremor is better in hands. She is drinking more water. Has lost about 13 lbs. Migraines were under good control, but increased recently.  With psychiatry remains on Austedo, Lyrica, propanolol, Seroquel. Jennifer [name] looks good today.  Update 06/12/20 SS: Jennifer [name] is a 32 year old female with history of schizophrenia, migraine headaches, and tremor.  Headaches are much improved with Zonegran, on average 1-2 migraines a month, has excellent benefit with Imitrex, Robaxin, and ibuprofen.  Tremor is overall stable, has adjusted propanolol to taking twice daily, second dose is in the evening.  Notes tremor with eating, mostly affects the arms.  On exam, seem to be more action, she claims a resting.  At times may have some speech stuttering.  She follows with psychiatry, has been switched from amantadine to Astudeo, unfortunately has resulted in weight gain (33 lbs last 6 months).  Is not currently working or in school.  Overall, is doing well, pleased with her current status.  Here today for evaluation accompanied by her  mother.  HISTORY 11/27/2019 Dr. Anne Hahn: Jennifer [name] is a 32 year old right-handed white female with a history of schizophrenia.  The patient has a history of migraine headaches and tremors that have been a chronic issue for her.  When last seen, she was placed on Zonegran in low-dose, she takes 50 mg at night.  The patient has done quite well with the Zonegran, she is only having about 1 headache a week and she tolerates the medication well.  She did not tolerate Topamax previously.  She denies any nausea and vomiting with the headache, she claims that she can function through the headache.  At times, while having a migraine she may have some stuttering speech problems.  This does not translate into other timeframes.  The patient reports that she does have tremors that affect the arms and occasionally in the legs.  She reports that the tremors are mainly a resting type tremor and they keep her awake at night.  The patient take propranolol 40 mg twice a day, but she takes the medication in the morning and then early afternoon and not in the evening.  The patient denies taking any caffeinated products.  She comes to this office for further evaluation.  The tremor does affect the handwriting.  She denies any prior use of lithium.   REVIEW OF SYSTEMS: Out of a complete 14 system review of symptoms, the patient complains only of the following symptoms, and all other reviewed systems are negative.  See HPI  ALLERGIES: Allergies  Allergen Reactions   Ambien [Zolpidem Tartrate]     hallucinations  Topiramate Hives    Cognitive side effects   Zolpidem Tartrate Other (See Comments)   Cogentin [Benztropine]     Rash, itching, dry mouth    HOME MEDICATIONS: Outpatient Medications Prior to Visit  Medication Sig Dispense Refill   albuterol (VENTOLIN HFA) 108 (90 Base) MCG/ACT inhaler INHALE 2 PUFFS EVERY 6 HOURS AS NEEDED FOR SHORTNESS OF BREATH OR WHEEZING (Patient taking differently: 2 puffs every 4  (four) hours as needed.) 18 g 2   atorvastatin (LIPITOR) 10 MG tablet Take 10 mg by mouth at bedtime.     cetirizine (ZYRTEC) 10 MG tablet Take 10 mg by mouth daily.     Deutetrabenazine (AUSTEDO) 12 MG TABS Take by mouth. 6mg  bid     FLUoxetine (PROZAC) 20 MG capsule Take 1 capsule by mouth in the morning and then 2 capsules by mouth in the evening 270 capsule 2   MELATONIN PO Take 10 mg by mouth as needed.      methocarbamol (ROBAXIN) 750 MG tablet Take 1 tablet (750 mg total) by mouth as needed for muscle spasms (take 1 at onset of headache). 30 tablet 3   Multiple Vitamin (MULTIVITAMIN PO) Take by mouth.     norethindrone (MICRONOR) 0.35 MG tablet TAKE 1 TABLET BY MOUTH ONCE DAILY 84 tablet 3   nystatin (MYCOSTATIN/NYSTOP) powder Apply 1 Application topically 3 (three) times daily. Apply to affected area for up to 7 days 15 g 3   pregabalin (LYRICA) 100 MG capsule Take 1 capsule by mouth 3 times daily. 270 capsule 1   PROAIR HFA 108 (90 BASE) MCG/ACT inhaler Inhale 1 puff into the lungs every 6 (six) hours as needed for wheezing or shortness of breath.      propranolol (INDERAL) 40 MG tablet Take 1 tablet by mouth 2 times a day 180 tablet 2   QUEtiapine (SEROQUEL) 100 MG tablet Take 1 tablet (100 mg total) by mouth 2 (two) times daily. 180 tablet 2   Rimegepant Sulfate (NURTEC) 75 MG TBDP Take 75 mg by mouth as needed (take 1 tablet at onset of headache, max is 1 tablet in 24 hours). 8 tablet 11   zonisamide (ZONEGRAN) 25 MG capsule TAKE 3 CAPSULES (75 MG TOTAL) BY MOUTH AT BEDTIME. 270 capsule 1   SUMAtriptan (IMITREX) 50 MG tablet TAKE 1 TABLET BY MOUTH AT ONSET OF MIGRAINE. MAY REPEAT IN 2 HRS, IF NEEDED. MAX DOSE: 2 TABS/DAY. THIS IS A 30 DAY PRESCRIPTION. 10 tablet 5   Amantadine HCl 100 MG tablet TAKE 1 TABLET BY MOUTH TWICE DAILY 360 tablet 2   No facility-administered medications prior to visit.    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 No pertinent surgical history.  FAMILY HISTORY: Family History  Problem Relation Age of Onset   Breast cancer Mother        BRAC negative  age 21   Diabetes Father    Hypertension Father    Pulmonary embolism Father    Thyroid disease Maternal Aunt    Breast cancer Maternal Aunt 33  BRAC negative   Breast cancer Maternal Aunt 30   Migraines Maternal Aunt    Thyroid disease Maternal Grandmother    Alzheimer's disease Maternal Grandfather    Lung cancer Paternal Grandmother    Depression Paternal Grandmother    Heart attack Paternal Grandfather    Depression Paternal Grandfather     SOCIAL HISTORY: Social History   Socioeconomic History   Marital status: Single    Spouse name: Not on file   Number of children: 0   Years of education: college/st   Highest education level: Not on file  Occupational History   Occupation: Dentist: UNEMPLOYED  Tobacco Use   Smoking status: Never   Smokeless tobacco: Never  Vaping Use   Vaping Use: Never used  Substance and Sexual Activity   Alcohol use: No   Drug use: No   Sexual activity: Never    Partners: Male    Birth control/protection: Pill    Comment: never sexually active  Other Topics Concern   Not on file  Social History Narrative   Patient is right handed.   Patient does not drink caffeine.   Patient lives at home with family.   Social Determinants of Health   Financial Resource Strain: Not on file  Food Insecurity: Not on file  Transportation Needs: Not on file  Physical Activity: Not on file  Stress: Not on file  Social Connections: Not on file  Intimate Partner Violence: Not on  file   PHYSICAL EXAM  Vitals:   06/29/22 1251  BP: 136/75  Pulse: 76  Weight: 233 lb (105.7 kg)  Height:  (1.676 m)   Body mass index is 37.61 kg/m.  Generalized: Well developed, in no acute distress  Neurological examination  Mentation: Alert oriented to time, place, most history is provided by her father, Tamiah is more talkative today. Follows all commands speech and language fluent,  Cranial nerve II-XII: Pupils were equal round reactive to light. Extraocular movements were full, visual field were full on confrontational test. Facial sensation and strength were normal. Head turning and shoulder shrug  were normal and symmetric. Motor: The motor testing reveals 5 over 5 strength of all 4 extremities. Good symmetric motor tone is noted throughout.  Sensory: Sensory testing is intact to soft touch on all 4 extremities. No evidence of extinction is noted.  Coordination: Very Mild tremor with finger-nose-finger bilaterally, heel-to-shin is normal Gait and station: Gait is normal.   Reflexes: Deep tendon reflexes are symmetric but brisk in the lower extremities  DIAGNOSTIC DATA (LABS, IMAGING, TESTING) - I reviewed patient records, labs, notes, testing and imaging myself where available.  Lab Results  Component Value Date   WBC 10.4 02/22/2016   HGB 14.1 02/22/2016   HCT 42.5 02/22/2016   MCV 89.7 02/22/2016   PLT 283 02/22/2016      Component Value Date/Time   NA 135 02/22/2016 2332   K 3.9 02/22/2016 2332   CL 105 02/22/2016 2332   CO2 23 02/22/2016 2332   GLUCOSE 116 (H) 02/22/2016 2332   BUN 17 02/22/2016 2332   CREATININE 0.61 02/22/2016 2332   CALCIUM 9.2 02/22/2016 2332   PROT 7.7 02/22/2016 2332   ALBUMIN 3.6 02/22/2016 2332   AST 17 02/22/2016 2332   ALT 20 02/22/2016 2332   ALKPHOS 75 02/22/2016 2332   BILITOT 0.3 02/22/2016 2332   GFRNONAA >60 02/22/2016 2332   GFRAA >60 02/22/2016 2332   Lab Results  Component Value  Date   CHOL 229 (H) 07/12/2015    HDL 52 07/12/2015   LDLCALC 155 (H) 07/12/2015   TRIG 111 07/12/2015   CHOLHDL 4.4 07/12/2015   Lab Results  Component Value Date   HGBA1C 5.4 07/12/2015   Lab Results  Component Value Date   VITAMINB12 719 01/16/2013   Lab Results  Component Value Date   TSH 1.670 07/12/2015   ASSESSMENT AND PLAN 32 y.o. 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 here with:  1.  History of schizophrenia 2.  Migraine headache 3.  Tremor, secondary parkinsonism  -Headaches under good control -Continue Zonegran 75 mg at bedtime for migraine preventative -Continue Nurtec 75 mg, along with Robaxin as needed for acute headache treatment -Try naproxen 500 mg twice a day 1 to 2 days before menstrual cycle, can continue for no more than 5 days -Also on Austedo, Lyrica, propanolol, Seroquel, from other providers -Previously tried and failed: Imitrex, Maxalt, Aimovig, Topamax -Follow-up in 1 year with Dr. Delena Bali to establish care (was supposed to see Dr. Delena Bali today but went out early on maternity leave)  Margie Ege, AGNP-C, DNP 06/29/2022, 1:12 PM [facility] [address] [place], [place] [address] [phone]

## 2022-12-28 ENCOUNTER — Telehealth: Payer: Self-pay

## 2022-12-28 ENCOUNTER — Other Ambulatory Visit (HOSPITAL_COMMUNITY): Payer: Self-pay

## 2022-12-28 NOTE — Telephone Encounter (Signed)
*  [facility]  Pharmacy Patient Advocate Encounter  Received notification from [facility] that Prior Authorization for Nurtec 75MG  dispersible tablets  has been APPROVED from 12/28/2022 to 12/28/2023. Ran test claim, Copay is $0.00. This test claim was processed through [facility]- copay amounts may vary at other pharmacies due to pharmacy/plan contracts, or as the patient moves through the different stages of their insurance plan.   PA #/Case ID/Reference #: [id number]

## 2023-03-14 ENCOUNTER — Other Ambulatory Visit: Payer: Self-pay | Admitting: Obstetrics and Gynecology

## 2023-03-14 DIAGNOSIS — N946 Dysmenorrhea, unspecified: Secondary | ICD-10-CM

## 2023-03-14 DIAGNOSIS — G43101 Migraine with aura, not intractable, with status migrainosus: Secondary | ICD-10-CM

## 2023-03-15 NOTE — Telephone Encounter (Signed)
 Med refill request:norethindrone  0.35 mg tab Last AEX: 04/26/22 -BS Next AEX:Not scheduled Last MMG (if hormonal med) N/A  Last RX sent 04/26/22, #84/3RF Patient should have Rx until next AEX due.   Call placed to patient, left detailed message, ok per dpr. Advised refill request received, return call to office at [phone], opt 1, schedule AEX due for 04/2023 and advise if you will need refill prior to next AEX. Last Rx sent was for #84/3R.   Rx refused.   Routing to provider.   Encounter closed.

## 2023-04-15 NOTE — Addendum Note (Signed)
Addended by: Jodelle Red D on: 04/15/2023 12:52 PM   Modules accepted: Orders

## 2023-04-15 NOTE — Telephone Encounter (Signed)
Thomes Cake D  P [facility] Triage The pts mom LM wanting to know why her [facility] was denied. If you would please give her a call [phone].  Thank you, Amanda   LVMTCB.

## 2023-04-15 NOTE — Telephone Encounter (Signed)
Spoke w/ the Delfin Edis (Mother) ok per [facility] and advised her that Landree needs to get scheduled for AEX since BS is booked until end of April/early May and she was agreeable.   Pt is now scheduled for 07/14/2023. Rx pend for approval.  Routing to provider.

## 2023-04-16 MED ORDER — NORETHINDRONE 0.35 MG PO TABS
1.0000 | ORAL_TABLET | Freq: Every day | ORAL | 3 refills | Status: DC
Start: 2023-04-16 — End: 2023-07-11

## 2023-04-20 NOTE — Telephone Encounter (Signed)
 Pt's mother (anne) on DPR notified by phone and voiced understanding/appreciation.

## 2023-04-27 ENCOUNTER — Telehealth: Payer: Self-pay | Admitting: Pharmacist

## 2023-04-27 NOTE — Telephone Encounter (Signed)
Pharmacy Patient Advocate Encounter   Received notification from CoverMyMeds that prior authorization for Nurtec 75MG  dispersible tablets is required/requested.   Insurance verification completed.   The patient is insured through [facility] .   Per test claim: PA required; PA submitted to above mentioned insurance via CoverMyMeds Key/confirmation #/EOC [id number] Status is pending

## 2023-04-28 NOTE — Telephone Encounter (Signed)
Pharmacy Patient Advocate Encounter  Received notification from [facility] that Prior Authorization for Nurtec 75MG  dispersible tablets has been APPROVED from 04/27/2023 to 04/26/2024   PA #/Case ID/Reference #:  [id number]

## 2023-06-21 NOTE — Progress Notes (Unsigned)
 PATIENT: Jennifer [name] DOB: Jan 28, 1991  REASON FOR VISIT: follow up HISTORY FROM: patient, mother PRIMARY NEUROLOGIST: Willis/Ahern   HISTORY OF PRESENT ILLNESS: Today 06/22/23 06/22/23 SS: Doing well, on Zonegran 75 mg daily for migraine prevention.  Most migraines are around her menstrual cycle. Unsure how many migraines, but doesn't take all # 8 Nurtec monthly.  Please for migraine control.  Austedo had been a Secretary/administrator for tremor and mental health. Is on progesterone birth control.   06/29/22 SS: At last visit, we increased the Zonegran from 50 to 75 mg. Has done well, down to 3 migraines a month around her period. For acute headache will take Nurtec and Robaxin. Headaches are not incapacitating. Will take a nap, it resolves within 4 hours. Tremor is better with Austedo. Still seeing psych NP.   12/03/2021 SS: Lindsey is here today for follow-up. Remains on Zonegran 50 mg at bedtime for headache prevention. Not taking Imitrex 50 mg, didn't feel like it was working. Takes 750 mg Robaxin for acute headache. Last week had 3 migraines when on her menstrual cycle. On average 5-6 migraines a month total. Has menstrual cycle of 3 days, usually has migraine those dose. Still seeing psych NP, doing well, tremor is better in hands. She is drinking more water. Has lost about 13 lbs. Migraines were under good control, but increased recently.  With psychiatry remains on Austedo, Lyrica, propanolol, Seroquel. Meron looks good today.  Update 06/12/20 SS: Jennifer [name] is a 33 year old female with history of schizophrenia, migraine headaches, and tremor.  Headaches are much improved with Zonegran, on average 1-2 migraines a month, has excellent benefit with Imitrex, Robaxin, and ibuprofen.  Tremor is overall stable, has adjusted propanolol to taking twice daily, second dose is in the evening.  Notes tremor with eating, mostly affects the arms.  On exam, seem to be more action, she claims a resting.  At times  may have some speech stuttering.  She follows with psychiatry, has been switched from amantadine to Astudeo, unfortunately has resulted in weight gain (33 lbs last 6 months).  Is not currently working or in school.  Overall, is doing well, pleased with her current status.  Here today for evaluation accompanied by her mother.  HISTORY 11/27/2019 Dr. Anne Hahn: Ms. Merida is a 33 year old right-handed white female with a history of schizophrenia.  The patient has a history of migraine headaches and tremors that have been a chronic issue for her.  When last seen, she was placed on Zonegran in low-dose, she takes 50 mg at night.  The patient has done quite well with the Zonegran, she is only having about 1 headache a week and she tolerates the medication well.  She did not tolerate Topamax previously.  She denies any nausea and vomiting with the headache, she claims that she can function through the headache.  At times, while having a migraine she may have some stuttering speech problems.  This does not translate into other timeframes.  The patient reports that she does have tremors that affect the arms and occasionally in the legs.  She reports that the tremors are mainly a resting type tremor and they keep her awake at night.  The patient take propranolol 40 mg twice a day, but she takes the medication in the morning and then early afternoon and not in the evening.  The patient denies taking any caffeinated products.  She comes to this office for further evaluation.  The tremor does affect the  handwriting.  She denies any prior use of lithium.   REVIEW OF SYSTEMS: Out of a complete 14 system review of symptoms, the patient complains only of the following symptoms, and all other reviewed systems are negative.  See HPI  ALLERGIES: Allergies  Allergen Reactions   Ambien [Zolpidem Tartrate]     hallucinations   Topiramate Hives    Cognitive side effects   Zolpidem Tartrate Other (See Comments)   Cogentin  [Benztropine]     Rash, itching, dry mouth    HOME MEDICATIONS: Outpatient Medications Prior to Visit  Medication Sig Dispense Refill   albuterol (VENTOLIN HFA) 108 (90 Base) MCG/ACT inhaler INHALE 2 PUFFS EVERY 6 HOURS AS NEEDED FOR SHORTNESS OF BREATH OR WHEEZING (Patient taking differently: 2 puffs every 4 (four) hours as needed.) 18 g 2   atorvastatin (LIPITOR) 10 MG tablet Take 10 mg by mouth at bedtime.     cetirizine (ZYRTEC) 10 MG tablet Take 10 mg by mouth daily.     Deutetrabenazine (AUSTEDO) 12 MG TABS Take by mouth. 6mg  bid     FLUoxetine (PROZAC) 20 MG capsule Take 1 capsule by mouth in the morning and then 2 capsules by mouth in the evening 270 capsule 2   MELATONIN PO Take 10 mg by mouth as needed.      Multiple Vitamin (MULTIVITAMIN PO) Take by mouth.     norethindrone (MICRONOR) 0.35 MG tablet TAKE 1 TABLET BY MOUTH ONCE DAILY 28 tablet 3   nystatin (MYCOSTATIN/NYSTOP) powder Apply 1 Application topically 3 (three) times daily. Apply to affected area for up to 7 days 15 g 3   pregabalin (LYRICA) 100 MG capsule Take 1 capsule by mouth 3 times daily. 270 capsule 1   PROAIR HFA 108 (90 BASE) MCG/ACT inhaler Inhale 1 puff into the lungs every 6 (six) hours as needed for wheezing or shortness of breath.      propranolol (INDERAL) 40 MG tablet Take 1 tablet by mouth 2 times a day 180 tablet 2   QUEtiapine (SEROQUEL) 100 MG tablet Take 1 tablet (100 mg total) by mouth 2 (two) times daily. 180 tablet 2   methocarbamol (ROBAXIN) 750 MG tablet Take 1 tablet (750 mg total) by mouth as needed for muscle spasms (take 1 at onset of headache). 30 tablet 3   Rimegepant Sulfate (NURTEC) 75 MG TBDP Take 1 tablet (75 mg total) by mouth as needed (take 1 tablet at onset of headache, max is 1 tablet in 24 hours). 8 tablet 11   zonisamide (ZONEGRAN) 25 MG capsule Take 3 capsules (75 mg total) by mouth at bedtime. 270 capsule 3   No facility-administered medications prior to visit.    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 No pertinent surgical history.  FAMILY HISTORY: Family History  Problem Relation Age of Onset   Breast cancer Mother        BRAC negative  age 24   Diabetes Father    Hypertension Father    Pulmonary embolism Father    Thyroid disease Maternal Aunt  Breast cancer Maternal Aunt 45       BRAC negative   Breast cancer Maternal Aunt 30   Migraines Maternal Aunt    Thyroid disease Maternal Grandmother    Alzheimer's disease Maternal Grandfather    Lung cancer Paternal Grandmother    Depression Paternal Grandmother    Heart attack Paternal Grandfather    Depression Paternal Grandfather     SOCIAL HISTORY: Social History   Socioeconomic History   Marital status: Single    Spouse name: Not on file   Number of children: 0   Years of education: college/st   Highest education level: Not on file  Occupational History   Occupation: Dentist: UNEMPLOYED  Tobacco Use   Smoking status: Never   Smokeless tobacco: Never  Vaping Use   Vaping status: Never Used  Substance and Sexual Activity   Alcohol use: No   Drug use: No   Sexual activity: Never    Partners: Male    Birth control/protection: Pill    Comment: never sexually active  Other Topics Concern   Not on file  Social History Narrative   Patient is right handed.   Patient does not drink caffeine.   Patient lives at home with family.   Social Drivers of Corporate investment banker Strain: Not on file  Food Insecurity: Not on file  Transportation Needs: Not on file  Physical Activity: Not on  file  Stress: Not on file  Social Connections: Not on file  Intimate Partner Violence: Not on file   PHYSICAL EXAM  Vitals:   06/22/23 0933  BP: 124/80  Pulse: 84  Weight: 233 lb (105.7 kg)  Height: 5\' 6"  (1.676 m)    Body mass index is 37.61 kg/m.  Generalized: Well developed, in no acute distress  Neurological examination  Mentation: Alert oriented to time, place, Hatsue is more talkative talkative provides most all her own history, Follows all commands speech and language fluent,  Cranial nerve II-XII: Pupils were equal round reactive to light. Extraocular movements were full, visual field were full on confrontational test. Facial sensation and strength were normal. Head turning and shoulder shrug  were normal and symmetric. Motor: The motor testing reveals 5 over 5 strength of all 4 extremities. Good symmetric motor tone is noted throughout.  Sensory: Sensory testing is intact to soft touch on all 4 extremities. No evidence of extinction is noted.  Coordination: Very Mild tremor with finger-nose-finger bilaterally, heel-to-shin is normal Gait and station: Gait is normal.   Reflexes: Deep tendon reflexes are symmetric but brisk in the lower extremities  DIAGNOSTIC DATA (LABS, IMAGING, TESTING) - I reviewed patient records, labs, notes, testing and imaging myself where available.  Lab Results  Component Value Date   WBC 10.4 02/22/2016   HGB 14.1 02/22/2016   HCT 42.5 02/22/2016   MCV 89.7 02/22/2016   PLT 283 02/22/2016      Component Value Date/Time   NA 135 02/22/2016 2332   K 3.9 02/22/2016 2332   CL 105 02/22/2016 2332   CO2 23 02/22/2016 2332   GLUCOSE 116 (H) 02/22/2016 2332   BUN 17 02/22/2016 2332   CREATININE 0.61 02/22/2016 2332   CALCIUM 9.2 02/22/2016 2332   PROT 7.7 02/22/2016 2332   ALBUMIN 3.6 02/22/2016 2332   AST 17 02/22/2016 2332   ALT 20 02/22/2016 2332   ALKPHOS 75 02/22/2016 2332   BILITOT 0.3 02/22/2016 2332   GFRNONAA >60 02/22/2016  2332  GFRAA >60 02/22/2016 2332   Lab Results  Component Value Date   CHOL 229 (H) 07/12/2015   HDL 52 07/12/2015   LDLCALC 155 (H) 07/12/2015   TRIG 111 07/12/2015   CHOLHDL 4.4 07/12/2015   Lab Results  Component Value Date   HGBA1C 5.4 07/12/2015   Lab Results  Component Value Date   VITAMINB12 719 01/16/2013   Lab Results  Component Value Date   TSH 1.670 07/12/2015   ASSESSMENT AND PLAN 33 y.o. 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 here with:  1.  History of schizophrenia 2.  Migraine headache with aura 3.  Tremor, secondary parkinsonism  -Headaches remain under good control  -Continue Zonegran 75 mg at bedtime for migraine preventative -Continue Nurtec 75 mg, along with Robaxin as needed for acute headache treatment -Try naproxen 500 mg twice a day 1 to 2 days before menstrual cycle, can continue for no more than 5 days -Also on Austedo, Lyrica, propanolol, Seroquel, from other providers -Previously tried and failed: Imitrex, Maxalt, Aimovig, Topamax -Follow-up in 1 year with me  There is increased risk for stroke in women with migraine with aura and a contraindication for the combined contraceptive pill for use by women who have migraine with aura. The risk for women with migraine without aura is lower. However other risk factors like smoking are far more likely to increase stroke risk than migraine. There is a recommendation for no smoking and for the use of OCPs without estrogen such as progestogen only pills particularly for women with migraine with aura.Marland Kitchen People who have migraine headaches with auras may be 3 times more likely to have a stroke caused by a blood clot, compared to migraine  patients who don't see auras. Women who take hormone-replacement therapy may be 30 percent more likely to suffer a clot-based stroke than women not taking medication containing estrogen. Other risk factors like smoking and high blood pressure may be  much more important. And stroke is still a rare complication due to migraine aura and is controversial and lower doses may not cause a risk.  Margie Ege, AGNP-C, DNP 06/22/2023, 10:23 AM [facility] [address] [place], [place] [address] [phone]

## 2023-06-22 ENCOUNTER — Ambulatory Visit (INDEPENDENT_AMBULATORY_CARE_PROVIDER_SITE_OTHER): Payer: BLUE CROSS/BLUE SHIELD | Admitting: Neurology

## 2023-06-22 ENCOUNTER — Encounter: Payer: Self-pay | Admitting: Neurology

## 2023-06-22 VITALS — BP 124/80 | HR 84 | Ht 66.0 in | Wt 233.0 lb

## 2023-06-22 DIAGNOSIS — G43019 Migraine without aura, intractable, without status migrainosus: Secondary | ICD-10-CM | POA: Diagnosis not present

## 2023-06-22 DIAGNOSIS — G250 Essential tremor: Secondary | ICD-10-CM

## 2023-06-22 DIAGNOSIS — G219 Secondary parkinsonism, unspecified: Secondary | ICD-10-CM | POA: Diagnosis not present

## 2023-06-22 MED ORDER — NURTEC 75 MG PO TBDP: 75.0000 mg | ORAL_TABLET | ORAL | 11 refills | Status: DC | PRN

## 2023-06-22 MED ORDER — METHOCARBAMOL 750 MG PO TABS: 750.0000 mg | ORAL_TABLET | ORAL | 3 refills | Status: AC | PRN

## 2023-06-22 MED ORDER — ZONISAMIDE 25 MG PO CAPS: 75.0000 mg | ORAL_CAPSULE | Freq: Every day | ORAL | 3 refills | Status: AC

## 2023-06-22 NOTE — Patient Instructions (Addendum)
 Continue current medications. Try naproxen 500 mg twice a day 1 to 2 days before menstrual cycle, can continue for no more than 5 days. Follow up in 1 year   Meds ordered this encounter  Medications   zonisamide (ZONEGRAN) 25 MG capsule    Sig: Take 3 capsules (75 mg total) by mouth at bedtime.    Dispense:  270 capsule    Refill:  3   Rimegepant Sulfate (NURTEC) 75 MG TBDP    Sig: Take 1 tablet (75 mg total) by mouth as needed (take 1 tablet at onset of headache, max is 1 tablet in 24 hours).    Dispense:  8 tablet    Refill:  11   methocarbamol (ROBAXIN) 750 MG tablet    Sig: Take 1 tablet (750 mg total) by mouth as needed for muscle spasms (take 1 at onset of headache).    Dispense:  30 tablet    Refill:  3

## 2023-06-29 ENCOUNTER — Ambulatory Visit: Payer: BLUE CROSS/BLUE SHIELD | Admitting: Neurology

## 2023-07-10 ENCOUNTER — Other Ambulatory Visit: Payer: Self-pay | Admitting: Obstetrics and Gynecology

## 2023-07-10 ENCOUNTER — Other Ambulatory Visit: Payer: Self-pay | Admitting: Neurology

## 2023-07-10 DIAGNOSIS — G43101 Migraine with aura, not intractable, with status migrainosus: Secondary | ICD-10-CM

## 2023-07-10 DIAGNOSIS — N946 Dysmenorrhea, unspecified: Secondary | ICD-10-CM

## 2023-07-11 NOTE — Telephone Encounter (Signed)
 Med refill request: POPs Last AEX: 04/26/2022-BS Next AEX: 07/14/2023-BS Last MMG (if hormonal med): n/a Refill authorized: rx pend.

## 2023-07-14 ENCOUNTER — Other Ambulatory Visit: Payer: Self-pay

## 2023-07-14 ENCOUNTER — Other Ambulatory Visit (HOSPITAL_COMMUNITY): Payer: Self-pay

## 2023-07-14 ENCOUNTER — Other Ambulatory Visit (HOSPITAL_COMMUNITY)
Admission: RE | Admit: 2023-07-14 | Discharge: 2023-07-14 | Disposition: A | Discharge status: Home / Self Care | Source: Ambulatory Visit | Attending: Obstetrics and Gynecology | Admitting: Obstetrics and Gynecology

## 2023-07-14 ENCOUNTER — Encounter: Payer: Self-pay | Admitting: Pharmacist

## 2023-07-14 ENCOUNTER — Encounter: Payer: Self-pay | Admitting: Obstetrics and Gynecology

## 2023-07-14 ENCOUNTER — Ambulatory Visit (INDEPENDENT_AMBULATORY_CARE_PROVIDER_SITE_OTHER): Payer: BLUE CROSS/BLUE SHIELD | Admitting: Obstetrics and Gynecology

## 2023-07-14 VITALS — BP 112/70 | HR 76 | Ht 65.5 in | Wt 234.0 lb

## 2023-07-14 DIAGNOSIS — N946 Dysmenorrhea, unspecified: Secondary | ICD-10-CM

## 2023-07-14 DIAGNOSIS — Z01419 Encounter for gynecological examination (general) (routine) without abnormal findings: Secondary | ICD-10-CM | POA: Diagnosis not present

## 2023-07-14 DIAGNOSIS — Z124 Encounter for screening for malignant neoplasm of cervix: Secondary | ICD-10-CM

## 2023-07-14 DIAGNOSIS — Z1331 Encounter for screening for depression: Secondary | ICD-10-CM

## 2023-07-14 DIAGNOSIS — G43101 Migraine with aura, not intractable, with status migrainosus: Secondary | ICD-10-CM

## 2023-07-14 MED ORDER — NORETHINDRONE 0.35 MG PO TABS
1.0000 | ORAL_TABLET | Freq: Every day | ORAL | 3 refills | Status: DC
  Filled 2023-07-14: qty 84, 84d supply, fill #0

## 2023-07-14 MED ORDER — NYSTATIN 100000 UNIT/GM EX POWD
1.0000 | Freq: Three times a day (TID) | CUTANEOUS | 3 refills | Status: AC
  Filled 2023-07-14: qty 30, 7d supply, fill #0
  Filled 2023-11-08: qty 30, 10d supply, fill #0

## 2023-07-14 NOTE — Patient Instructions (Addendum)
 EXERCISE AND DIET:  We recommended that you start or continue a regular exercise program for good health. Regular exercise means any activity that makes your heart beat faster and makes you sweat.  We recommend exercising at least 30 minutes per day at least 3 days a week, preferably 4 or 5.  We also recommend a diet low in fat and sugar.  Inactivity, poor dietary choices and obesity can cause diabetes, heart attack, stroke, and kidney damage, among others.    ALCOHOL AND SMOKING:  Women should limit their alcohol intake to no more than 7 drinks/beers/glasses of wine (combined, not each!) per week. Moderation of alcohol intake to this level decreases your risk of breast cancer and liver damage. And of course, no recreational drugs are part of a healthy lifestyle.  And absolutely no smoking or even second hand smoke. Most people know smoking can cause heart and lung diseases, but did you know it also contributes to weakening of your bones? Aging of your skin?  Yellowing of your teeth and nails?  CALCIUM AND VITAMIN D:  Adequate intake of calcium and Vitamin D are recommended.  The recommendations for exact amounts of these supplements seem to change often, but generally speaking 600 mg of calcium (either carbonate or citrate) and 800 units of Vitamin D per day seems prudent. Certain women may benefit from higher intake of Vitamin D.  If you are among these women, your doctor will have told you during your visit.    PAP SMEARS:  Pap smears, to check for cervical cancer or precancers,  have traditionally been done yearly, although recent scientific advances have shown that most women can have pap smears less often.  However, every woman still should have a physical exam from her gynecologist every year. It will include a breast check, inspection of the vulva and vagina to check for abnormal growths or skin changes, a visual exam of the cervix, and then an exam to evaluate the size and shape of the uterus and  ovaries.  And after 31 years of age, a rectal exam is indicated to check for rectal cancers. We will also provide age appropriate advice regarding health maintenance, like when you should have certain vaccines, screening for sexually transmitted diseases, bone density testing, colonoscopy, mammograms, etc.   MAMMOGRAMS:  All women over 71 years old should have a yearly mammogram. Many facilities now offer a "3D" mammogram, which may cost around $50 extra out of pocket. If possible,  we recommend you accept the option to have the 3D mammogram performed.  It both reduces the number of women who will be called back for extra views which then turn out to be normal, and it is better than the routine mammogram at detecting truly abnormal areas.    COLONOSCOPY:  Colonoscopy to screen for colon cancer is recommended for all women at age 54.  We know, you hate the idea of the prep.  We agree, BUT, having colon cancer and not knowing it is worse!!  Colon cancer so often starts as a polyp that can be seen and removed at colonscopy, which can quite literally save your life!  And if your first colonoscopy is normal and you have no family history of colon cancer, most women don't have to have it again for 10 years.  Once every ten years, you can do something that may end up saving your life, right?  We will be happy to help you get it scheduled when you are ready.  Be sure to check your insurance coverage so you understand how much it will cost.  It may be covered as a preventative service at no cost, but you should check your particular policy.     Human Papillomavirus (HPV) Vaccine Injection What is this medication? HUMAN PAPILLOMAVIRUS VACCINE (HYOO muhn pap uh LOH muh vahy ruhs vak SEEN) reduces the risk of human papillomavirus (HPV). It does not treat HPV. It is still possible to get HPV after receiving this vaccine, but the symptoms may be less severe or not last as long. It works by helping your immune system learn  how to fight off a future infection. This medicine may be used for other purposes; ask your health care provider or pharmacist if you have questions. COMMON BRAND NAME(S): Gardasil 9 What should I tell my care team before I take this medication? They need to know if you have any of these conditions: Fever Hemophilia HIV or AIDS Immune system problems Infection Low platelets An unusual reaction to human papillomavirus vaccine, yeast, other vaccines, other medications, foods, dyes, or preservatives Pregnant or trying to get pregnant Breastfeeding How should I use this medication? This vaccine is injected into a muscle. It is given by your care team. This vaccine requires 2 or 3 doses to get the full benefit. Set a reminder for when your next dose is due. A copy of the Vaccine Information Statement will be given before each vaccination. Be sure to read this information carefully each time. This sheet may change often. Talk to your care team about the use of this medication in children. While it may be prescribed for children as young as 9 years for selected conditions, precautions do apply. Overdosage: If you think you have taken too much of this medicine contact a poison control center or emergency room at once. NOTE: This medicine is only for you. Do not share this medicine with others. What if I miss a dose? Keep appointments for follow-up doses as directed. It is important not to miss your dose. Call your care team if you are unable to keep an appointment. What may interact with this medication? Certain medications for arthritis Medications for organ transplant Medications to treat cancer Steroid medications, such as prednisone or cortisone This list may not describe all possible interactions. Give your health care provider a list of all the medicines, herbs, non-prescription drugs, or dietary supplements you use. Also tell them if you smoke, drink alcohol, or use illegal drugs. Some  items may interact with your medicine. What should I watch for while using this medication? Visit your care team regularly. Report any side effects to your care team right away. This vaccine, like all vaccines, may not fully protect everyone. What side effects may I notice from receiving this medication? Side effects that you should report to your care team as soon as possible: Allergic reactions--skin rash, itching, hives, swelling of the face, lips, tongue, or throat Feeling faint or lightheaded Side effects that usually do not require medical attention (report these to your care team if they continue or are bothersome): Diarrhea Dizziness Fatigue Fever Headache Nausea Pain, redness, irritation, or bruising at the injection site This list may not describe all possible side effects. Call your doctor for medical advice about side effects. You may report side effects to FDA at 1-800-FDA-1088. Where should I keep my medication? This vaccine is only given by your care team. It will not be stored at home. NOTE: This sheet is a summary. It  may not cover all possible information. If you have questions about this medicine, talk to your doctor, pharmacist, or health care provider.  2024 Elsevier/Gold Standard (2021-08-12 00:00:00)

## 2023-07-14 NOTE — Progress Notes (Signed)
 33 y.o. G0P0000 Single Caucasian non-Hispanic female here for annual exam.    Needs refill of Nystatin  powder for heat rash.    Has monthly menses with Micronor .  No period problems.    Headaches are better.  Using Nurtec.  Care at [facility].   Not sexually active.   Likes to read while outside.   PCP: Omie Bickers, MD   Patient's last menstrual period was 06/20/2023 (approximate).     Period Cycle (Days):  (28-30) Period Duration (Days): 3-5 Menstrual Flow: Heavy Menstrual Control: Maxi pad Dysmenorrhea: (!) Moderate Dysmenorrhea Symptoms: Cramping, Nausea, Headache     Sexually active: No.  The current method of family planning is oral progesterone-only contraceptive/abstinence.    Menopausal hormone therapy: none Exercising: No.     Smoker:  no  OB History  Gravida Para Term Preterm AB Living  0 0 0 0 0 0  SAB IAB Ectopic Multiple Live Births  0 0 0 0      HEALTH MAINTENANCE: Last 2 paps: 2019-WNL, 04/15/2021-WNL History of abnormal Pap or positive HPV:  no Mammogram: Lt Breast US  09/23/2014-neg birads 1 Colonoscopy: n/a   Bone Density: n/a  Result n/a   Immunization History  Administered Date(s) Administered   HPV 9-valent 04/15/2021, 06/16/2021, 10/20/2021   HPV Quadrivalent 03/15/2009   Hepatitis B 03/15/2004   Tdap 03/15/2006, 09/28/2016      reports that she has never smoked. She has never used smokeless tobacco. She reports that she does not drink alcohol and does not use drugs.  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 No pertinent surgical history.  Current Outpatient Medications  Medication  Sig Dispense Refill   albuterol  (VENTOLIN  HFA) 108 (90 Base) MCG/ACT inhaler INHALE 2 PUFFS EVERY 6 HOURS AS NEEDED FOR SHORTNESS OF BREATH OR WHEEZING (Patient taking differently: 2 puffs every 4 (four) hours as needed.) 18 g 2   Amantadine  HCl 100 MG tablet take one tablet twice a day take one tablet twice a day     atorvastatin (LIPITOR) 10 MG tablet Take 10 mg by mouth at bedtime.     cetirizine (ZYRTEC) 10 MG tablet Take 10 mg by mouth daily.     Deutetrabenazine (AUSTEDO) 12 MG TABS Take by mouth. 6mg  bid     FLUoxetine  (PROZAC ) 20 MG capsule Take 1 capsule by mouth in the morning and then 2 capsules by mouth in the evening 270 capsule 2   MELATONIN PO Take 10 mg by mouth as needed.      methocarbamol  (ROBAXIN ) 750 MG tablet Take 1 tablet (750 mg total) by mouth as needed for muscle spasms (take 1 at onset of headache). 30 tablet 3   Multiple Vitamin (MULTIVITAMIN PO) Take by mouth.     norethindrone  (MICRONOR ) 0.35 MG tablet TAKE 1 TABLET BY MOUTH EVERY DAY 28 tablet 0   NURTEC 75 MG TBDP TAKE 1 TABLET BY MOUTH AT ONSET OF HEADACHE AS NEEDED - MAX: 1 TAB/24 HOURS 8 tablet 11   nystatin  (MYCOSTATIN /NYSTOP ) powder Apply 1 Application topically 3 (three) times daily. Apply to  affected area for up to 7 days 15 g 3   pregabalin  (LYRICA ) 100 MG capsule Take 1 capsule by mouth 3 times daily. 270 capsule 1   PROAIR  HFA 108 (90 BASE) MCG/ACT inhaler Inhale 1 puff into the lungs every 6 (six) hours as needed for wheezing or shortness of breath.      propranolol  (INDERAL ) 40 MG tablet Take 1 tablet by mouth 2 times a day 180 tablet 2   QUEtiapine  (SEROQUEL ) 100 MG tablet Take 1 tablet (100 mg total) by mouth 2 (two) times daily. 180 tablet 2   zonisamide  (ZONEGRAN ) 25 MG capsule Take 3 capsules (75 mg total) by mouth at bedtime. 270 capsule 3   No current facility-administered medications for this visit.    ALLERGIES: Ambien [zolpidem tartrate], Topiramate , Zolpidem tartrate, and Cogentin   [benztropine ]  Family History  Problem Relation Age of Onset   Breast cancer Mother        BRAC negative  age 77   Diabetes Father    Hypertension Father    Pulmonary embolism Father    Thyroid disease Maternal Aunt    Breast cancer Maternal Aunt 88       BRAC negative   Breast cancer Maternal Aunt 30   Migraines Maternal Aunt    Thyroid disease Maternal Grandmother    Alzheimer's disease Maternal Grandfather    Lung cancer Paternal Grandmother    Depression Paternal Grandmother    Heart attack Paternal Grandfather    Depression Paternal Grandfather     Review of Systems  All other systems reviewed and are negative.   PHYSICAL EXAM:  BP 112/70   Pulse 76   Ht 5' 5.5" (1.664 m)   Wt 234 lb (106.1 kg)   LMP 06/20/2023 (Approximate)   SpO2 97%   BMI 38.35 kg/m     General appearance: alert, cooperative and appears stated age Head: normocephalic, without obvious abnormality, atraumatic Neck: no adenopathy, supple, symmetrical, trachea midline and thyroid normal to inspection and palpation Lungs: clear to auscultation bilaterally Breasts: normal appearance, no masses or tenderness, No nipple retraction or dimpling, No nipple discharge or bleeding, No axillary adenopathy Heart: regular rate and rhythm Abdomen: soft, non-tender; no masses, no organomegaly Extremities: extremities normal, atraumatic, no cyanosis or edema Skin: skin color, texture, turgor normal. No rashes or lesions.  Lymph nodes: cervical, supraclavicular, and axillary nodes normal. Neurologic: grossly normal  Pelvic: External genitalia:  no lesions              No abnormal inguinal nodes palpated.              Urethra:  normal appearing urethra with no masses, tenderness or lesions              Bartholins and Skenes: normal                 Vagina: normal appearing vagina with normal color and discharge, no lesions              Cervix: no lesions              Pap taken: yes Bimanual Exam:  Uterus:   normal size, contour, position, consistency, mobility, non-tender              Adnexa: no mass, fullness, tenderness            Chaperone was present for exam:  Alesia Husky, CMA  ASSESSMENT: Well woman visit with gynecologic exam. Migraine with aura.  FH  of breast cancer.  Mother negative for BRCA. Hx dysmenorrhea. Controlled on Micronor .  Schizophrenia with secondary Parkinson's. PHQ-9: 0  PLAN: Mammogram screening discussed.  Start at age 59 due to mother's onset of breast cancer at age 48.  Self breast awareness reviewed. Pap and HRV collected:  yes Guidelines for Calcium, Vitamin D, regular exercise program including cardiovascular and weight bearing exercise. Medication refills:  Micronor , 3 packs and 3 refills.  Nystatin  powder.  HPV vaccine has been completed, noted on chart review. Follow up:  yearly and prn.

## 2023-07-15 LAB — CYTOLOGY - PAP
Adequacy: ABSENT
Comment: NEGATIVE
Diagnosis: NEGATIVE
High risk HPV: NEGATIVE

## 2023-07-19 ENCOUNTER — Other Ambulatory Visit: Payer: Self-pay

## 2023-07-19 ENCOUNTER — Encounter: Payer: Self-pay | Admitting: Obstetrics and Gynecology

## 2023-08-08 ENCOUNTER — Other Ambulatory Visit: Payer: Self-pay | Admitting: Obstetrics and Gynecology

## 2023-08-08 DIAGNOSIS — N946 Dysmenorrhea, unspecified: Secondary | ICD-10-CM

## 2023-08-08 DIAGNOSIS — G43101 Migraine with aura, not intractable, with status migrainosus: Secondary | ICD-10-CM

## 2023-08-09 NOTE — Telephone Encounter (Signed)
 Med refill request: Micronor   Last AEX: 07/14/23 Next AEX: 07/23/24 Last MMG (if hormonal med) n/a Refill authorized: Last rx 07/14/23. Patient is now requesting medication be sent to a new pharmacy- [facility] in [place]. Please advise

## 2023-11-08 ENCOUNTER — Other Ambulatory Visit: Payer: Self-pay

## 2023-11-08 ENCOUNTER — Other Ambulatory Visit (HOSPITAL_COMMUNITY): Payer: Self-pay

## 2023-11-08 ENCOUNTER — Encounter: Payer: Self-pay | Admitting: Pharmacist

## 2023-11-11 ENCOUNTER — Other Ambulatory Visit: Payer: Self-pay

## 2023-12-23 ENCOUNTER — Telehealth: Payer: Self-pay

## 2023-12-23 ENCOUNTER — Other Ambulatory Visit (HOSPITAL_COMMUNITY): Payer: Self-pay

## 2023-12-23 NOTE — Telephone Encounter (Signed)
 Pharmacy Patient Advocate Encounter   Received notification from CoverMyMeds that prior authorization for Nurtec is required/requested.   Insurance verification completed.   The patient is insured through [employer].   Per test claim: The current 30 day co-pay is, $4.00.  No PA needed at this time. This test claim was processed through [facility]- copay amounts may vary at other pharmacies due to pharmacy/plan contracts, or as the patient moves through the different stages of their insurance plan.

## 2024-06-26 ENCOUNTER — Ambulatory Visit: Admitting: Neurology

## 2024-07-23 ENCOUNTER — Ambulatory Visit: Admitting: Obstetrics and Gynecology
